# Patient Record
Sex: Female | Born: 1955 | Race: White | Hispanic: No
Health system: Midwestern US, Academic
[De-identification: ages and names within clinical notes are randomized; demographics above are authoritative.]

---

## 2018-03-01 ENCOUNTER — Encounter: Admit: 2018-03-01 | Discharge: 2018-03-01 | Payer: MEDICARE

## 2018-03-31 ENCOUNTER — Encounter: Admit: 2018-03-31 | Discharge: 2018-03-31 | Payer: MEDICARE

## 2018-03-31 ENCOUNTER — Inpatient Hospital Stay: Admit: 2018-03-31 | Discharge: 2018-03-31 | Payer: MEDICARE

## 2018-03-31 ENCOUNTER — Encounter: Admit: 2018-03-31 | Discharge: 2018-03-31

## 2018-03-31 ENCOUNTER — Inpatient Hospital Stay
Admit: 2018-03-31 | Discharge: 2018-04-07 | Disposition: A | Discharge status: Home / Self Care | Payer: MEDICARE | Source: Other Acute Inpatient Hospital | Attending: Cardiovascular Disease | Admitting: Cardiovascular Disease

## 2018-03-31 DIAGNOSIS — I25118 Atherosclerotic heart disease of native coronary artery with other forms of angina pectoris: ICD-10-CM

## 2018-03-31 DIAGNOSIS — R69 Illness, unspecified: Principal | ICD-10-CM

## 2018-03-31 DIAGNOSIS — I2510 Atherosclerotic heart disease of native coronary artery without angina pectoris: Secondary | ICD-10-CM

## 2018-03-31 LAB — COMPREHENSIVE METABOLIC PANEL
Lab: 1.1 mg/dL — ABNORMAL HIGH (ref 0.3–1.2)
Lab: 1.1 mg/dL — ABNORMAL HIGH (ref 0.4–1.00)
Lab: 144 MMOL/L — ABNORMAL LOW (ref 137–147)
Lab: 15 K/UL — ABNORMAL HIGH (ref 3–12)
Lab: 16 mg/dL (ref 7–25)
Lab: 21 MMOL/L (ref 21–30)
Lab: 283 mg/dL — ABNORMAL HIGH (ref 70–100)
Lab: 3.3 g/dL — ABNORMAL LOW (ref 3.5–5.0)
Lab: 41 U/L — ABNORMAL HIGH (ref 7–56)
Lab: 49 mL/min — ABNORMAL LOW (ref >60)
Lab: 5.7 g/dL — ABNORMAL LOW (ref 6.0–8.0)
Lab: 60 mL/min — ABNORMAL LOW (ref >60)
Lab: 7.5 mg/dL — ABNORMAL LOW (ref 8.5–10.6)
Lab: 71 U/L — ABNORMAL HIGH (ref 7–40)

## 2018-03-31 LAB — BLOOD GASES, ARTERIAL
Lab: 131 mmHg — ABNORMAL HIGH (ref 80–100)
Lab: 21 MMOL/L (ref 21–28)
Lab: 3.1 MMOL/L
Lab: 36 mmHg (ref 35–45)
Lab: 7.3 (ref 7.35–7.45)
Lab: 98 % (ref 95–99)

## 2018-03-31 LAB — LACTIC ACID (BG - RAPID LACTATE): Lab: 6.7 MMOL/L — ABNORMAL HIGH (ref 0.5–2.0)

## 2018-03-31 LAB — CBC AND DIFF
Lab: 0 10*3/uL (ref 0–0.20)
Lab: 21 10*3/uL — ABNORMAL HIGH (ref 4.5–11.0)

## 2018-03-31 LAB — TROPONIN-I: Lab: 0.8 ng/mL — ABNORMAL HIGH (ref 0.0–0.05)

## 2018-03-31 LAB — PROTIME INR (PT): Lab: 1.2 M/UL — ABNORMAL LOW (ref 0.8–1.2)

## 2018-03-31 LAB — MAGNESIUM: Lab: 1.8 mg/dL — ABNORMAL LOW (ref 1.6–2.6)

## 2018-03-31 LAB — LACTIC ACID(LACTATE): Lab: 7.8 MMOL/L — ABNORMAL HIGH (ref 0.5–2.0)

## 2018-03-31 MED ORDER — PROPOFOL 10 MG/ML IV EMUL
5-150 ug/kg/min | INTRAVENOUS | 0 refills | Status: DC
Start: 2018-03-31 — End: 2018-04-01
  Administered 2018-03-31 – 2018-04-01 (×2): 15 ug/kg/min via INTRAVENOUS

## 2018-03-31 MED ORDER — POTASSIUM CHLORIDE 20 MEQ PO TBTQ
60 meq | Freq: Once | ORAL | 0 refills | Status: AC
Start: 2018-03-31 — End: ?

## 2018-03-31 MED ORDER — FAMOTIDINE 20 MG PO TAB
20 mg | Freq: Two times a day (BID) | NASOGASTRIC | 0 refills | Status: DC
Start: 2018-03-31 — End: 2018-04-01
  Administered 2018-04-01 (×2): 20 mg via NASOGASTRIC

## 2018-03-31 MED ORDER — CEFEPIME 2G/100ML NS IVPB (MB+)
2 g | Freq: Two times a day (BID) | INTRAVENOUS | 0 refills | Status: DC
Start: 2018-03-31 — End: 2018-04-03
  Administered 2018-03-31 – 2018-04-03 (×12): 2 g via INTRAVENOUS

## 2018-03-31 MED ORDER — MAGNESIUM SULFATE IN D5W 1 GRAM/100 ML IV PGBK
1 g | INTRAVENOUS | 0 refills | Status: CP
Start: 2018-03-31 — End: ?
  Administered 2018-03-31 (×2): 1 g via INTRAVENOUS

## 2018-03-31 MED ORDER — CHLORHEXIDINE GLUCONATE 0.12 % MM MWSH
15 mL | Freq: Two times a day (BID) | ORAL | 0 refills | Status: DC
Start: 2018-03-31 — End: 2018-04-01
  Administered 2018-04-01 (×2): 15 mL via ORAL

## 2018-03-31 MED ORDER — ALBUTEROL SULFATE 2.5 MG/0.5 ML IN NEBU
2.5 mg | RESPIRATORY_TRACT | 0 refills | Status: DC | PRN
Start: 2018-03-31 — End: 2018-04-01
  Administered 2018-04-01 (×4): 2.5 mg via RESPIRATORY_TRACT

## 2018-03-31 MED ORDER — POLYETHYLENE GLYCOL 3350 17 GRAM PO PWPK
1 | Freq: Every day | NASOGASTRIC | 0 refills | Status: DC
Start: 2018-03-31 — End: 2018-04-01
  Administered 2018-04-01: 13:00:00 17 g via NASOGASTRIC

## 2018-03-31 MED ORDER — ASPIRIN 81 MG PO CHEW
81 mg | Freq: Every day | NASOGASTRIC | 0 refills | Status: DC
Start: 2018-03-31 — End: 2018-04-01
  Administered 2018-03-31 – 2018-04-01 (×2): 81 mg via NASOGASTRIC

## 2018-03-31 MED ORDER — IPRATROPIUM BROMIDE 0.02 % IN SOLN
0.5 mg | RESPIRATORY_TRACT | 0 refills | Status: DC | PRN
Start: 2018-03-31 — End: 2018-04-01
  Administered 2018-04-01 (×4): 0.5 mg via RESPIRATORY_TRACT

## 2018-03-31 MED ORDER — POTASSIUM CHLORIDE 20 MEQ/15 ML PO LIQD
60 meq | Freq: Once | ORAL | 0 refills | Status: CP
Start: 2018-03-31 — End: ?
  Administered 2018-04-01: 01:00:00 60 meq via ORAL

## 2018-03-31 MED ORDER — VANCOMYCIN 1G/250ML D5W IVPB (VIAL2BAG)
15 mg/kg | INTRAVENOUS | 0 refills | Status: DC
Start: 2018-03-31 — End: 2018-04-04
  Administered 2018-04-01 – 2018-04-03 (×8): 1000 mg via INTRAVENOUS

## 2018-03-31 MED ORDER — POTASSIUM CHLORIDE IN WATER 10 MEQ/50 ML IV PGBK
10 meq | INTRAVENOUS | 0 refills | Status: CP
Start: 2018-03-31 — End: ?
  Administered 2018-03-31 – 2018-04-01 (×3): 10 meq via INTRAVENOUS

## 2018-03-31 MED ORDER — IPRATROPIUM BROMIDE 0.02 % IN SOLN
0.5 mg | RESPIRATORY_TRACT | 0 refills | Status: DC | PRN
Start: 2018-03-31 — End: 2018-03-31

## 2018-03-31 MED ORDER — TIOTROPIUM BROMIDE 18 MCG IN CPDV
1 | Freq: Every day | RESPIRATORY_TRACT | 0 refills | Status: DC
Start: 2018-03-31 — End: 2018-04-01

## 2018-03-31 MED ORDER — VANCOMYCIN PHARMACY TO MANAGE
1 | 0 refills | Status: DC
Start: 2018-03-31 — End: 2018-04-04

## 2018-03-31 MED ORDER — FENTANYL CITRATE (PF) 50 MCG/ML IJ SOLN
25 ug | Freq: Once | INTRAVENOUS | 0 refills | Status: CP
Start: 2018-03-31 — End: ?
  Administered 2018-04-01: 01:00:00 25 ug via INTRAVENOUS

## 2018-03-31 MED ORDER — NOREPINEPHRINE IV DRIP (STD CONC)
.01-.3 ug/kg/min | INTRAVENOUS | 0 refills | Status: DC
Start: 2018-03-31 — End: 2018-04-02
  Administered 2018-03-31 (×2): 0.04 ug/kg/min via INTRAVENOUS

## 2018-03-31 MED ORDER — ALBUTEROL SULFATE 2.5 MG/0.5 ML IN NEBU
2.5 mg | RESPIRATORY_TRACT | 0 refills | Status: DC | PRN
Start: 2018-03-31 — End: 2018-03-31

## 2018-03-31 MED ORDER — ATORVASTATIN 40 MG PO TAB
40 mg | Freq: Every evening | NASOGASTRIC | 0 refills | Status: DC
Start: 2018-03-31 — End: 2018-04-01
  Administered 2018-04-01: 01:00:00 40 mg via NASOGASTRIC

## 2018-03-31 MED ADMIN — POTASSIUM CHLORIDE IN WATER 10 MEQ/50 ML IV PGBK [11075]: 10 meq | INTRAVENOUS | @ 22:00:00 | Stop: 2018-03-31 | NDC 00338070541

## 2018-04-01 ENCOUNTER — Inpatient Hospital Stay: Admit: 2018-04-01 | Discharge: 2018-04-01 | Payer: MEDICARE

## 2018-04-01 ENCOUNTER — Encounter: Admit: 2018-04-01 | Discharge: 2018-04-01 | Payer: MEDICARE

## 2018-04-01 DIAGNOSIS — Z87891 Personal history of nicotine dependence: ICD-10-CM

## 2018-04-01 DIAGNOSIS — I999 Unspecified disorder of circulatory system: ICD-10-CM

## 2018-04-01 DIAGNOSIS — E785 Hyperlipidemia, unspecified: Secondary | ICD-10-CM

## 2018-04-01 DIAGNOSIS — I255 Ischemic cardiomyopathy: ICD-10-CM

## 2018-04-01 DIAGNOSIS — I2510 Atherosclerotic heart disease of native coronary artery without angina pectoris: ICD-10-CM

## 2018-04-01 DIAGNOSIS — I739 Peripheral vascular disease, unspecified: ICD-10-CM

## 2018-04-01 DIAGNOSIS — I219 Acute myocardial infarction, unspecified: ICD-10-CM

## 2018-04-01 DIAGNOSIS — M1990 Unspecified osteoarthritis, unspecified site: ICD-10-CM

## 2018-04-01 DIAGNOSIS — R200 Anesthesia of skin: ICD-10-CM

## 2018-04-01 DIAGNOSIS — I724 Aneurysm of artery of lower extremity: ICD-10-CM

## 2018-04-01 DIAGNOSIS — Z720 Tobacco use: ICD-10-CM

## 2018-04-01 DIAGNOSIS — J95821 Acute postprocedural respiratory failure: ICD-10-CM

## 2018-04-01 DIAGNOSIS — Z8679 Personal history of other diseases of the circulatory system: ICD-10-CM

## 2018-04-01 DIAGNOSIS — M109 Gout, unspecified: ICD-10-CM

## 2018-04-01 LAB — BASIC METABOLIC PANEL
Lab: 1 mg/dL — ABNORMAL HIGH (ref 0.4–1.00)
Lab: 109 MMOL/L — ABNORMAL LOW (ref 98–110)
Lab: 12 pg (ref 3–12)
Lab: 142 MMOL/L — ABNORMAL LOW (ref 137–147)
Lab: 17 mg/dL — ABNORMAL HIGH (ref 7–25)
Lab: 185 mg/dL — ABNORMAL HIGH (ref 70–100)
Lab: 21 MMOL/L — ABNORMAL LOW (ref 21–30)
Lab: 4.2 MMOL/L — ABNORMAL LOW (ref 3.5–5.1)
Lab: 56 mL/min — ABNORMAL LOW (ref >60)
Lab: 7.9 mg/dL — ABNORMAL LOW (ref 8.5–10.6)
Lab: >60 mL/min (ref >60)
Lab: 142 MMOL/L — ABNORMAL HIGH (ref 137–147)

## 2018-04-01 LAB — LACTIC ACID(LACTATE)
Lab: 7 MMOL/L — ABNORMAL HIGH (ref 0.5–2.0)
Lab: 3.7 MMOL/L — ABNORMAL HIGH (ref 0.5–2.0)
Lab: 4.1 MMOL/L — ABNORMAL HIGH (ref 0.5–2.0)
Lab: 4.8 MMOL/L — ABNORMAL HIGH (ref 0.5–2.0)
Lab: 3 MMOL/L — ABNORMAL HIGH (ref 0.5–2.0)

## 2018-04-01 LAB — TROPONIN-I
Lab: 1.3 ng/mL — ABNORMAL HIGH (ref 0.0–0.05)
Lab: 1 ng/mL — ABNORMAL HIGH (ref 0.0–0.05)

## 2018-04-01 LAB — LACTIC ACID (BG - RAPID LACTATE)
Lab: 5.6 MMOL/L — ABNORMAL HIGH (ref 0.5–2.0)
Lab: 4.8 MMOL/L — ABNORMAL HIGH (ref 0.5–2.0)
Lab: 3.3 MMOL/L — ABNORMAL HIGH (ref 0.5–2.0)

## 2018-04-01 LAB — MAGNESIUM
Lab: 2.2 mg/dL (ref 1.6–2.6)
Lab: 2.3 mg/dL — ABNORMAL LOW (ref >60)

## 2018-04-01 LAB — PROCALCITONIN: Lab: 12 ng/mL — ABNORMAL HIGH (ref <0.10)

## 2018-04-01 LAB — CBC: Lab: 15 K/UL — ABNORMAL HIGH (ref >60)

## 2018-04-01 LAB — BLOOD GASES, ARTERIAL: Lab: 7.4 M/UL — ABNORMAL HIGH (ref >60)

## 2018-04-01 MED ORDER — POLYETHYLENE GLYCOL 3350 17 GRAM PO PWPK
1 | Freq: Every day | NASOGASTRIC | 0 refills | Status: DC
Start: 2018-04-01 — End: 2018-04-07
  Administered 2018-04-03 – 2018-04-07 (×3): 17 g via NASOGASTRIC

## 2018-04-01 MED ORDER — METOPROLOL TARTRATE 25 MG PO TAB
12.5 mg | Freq: Two times a day (BID) | ORAL | 0 refills | Status: DC
Start: 2018-04-01 — End: 2018-04-04
  Administered 2018-04-01 – 2018-04-04 (×6): 12.5 mg via ORAL

## 2018-04-01 MED ORDER — ASPIRIN 81 MG PO CHEW
81 mg | Freq: Every day | NASOGASTRIC | 0 refills | Status: DC
Start: 2018-04-01 — End: 2018-04-07
  Administered 2018-04-02 – 2018-04-07 (×6): 81 mg via NASOGASTRIC

## 2018-04-01 MED ORDER — FENTANYL CITRATE (PF) 50 MCG/ML IJ SOLN
25 ug | INTRAVENOUS | 0 refills | Status: DC | PRN
Start: 2018-04-01 — End: 2018-04-03
  Administered 2018-04-01: 13:00:00 25 ug via INTRAVENOUS

## 2018-04-01 MED ORDER — ATORVASTATIN 40 MG PO TAB
40 mg | Freq: Every evening | NASOGASTRIC | 0 refills | Status: DC
Start: 2018-04-01 — End: 2018-04-07
  Administered 2018-04-02 – 2018-04-07 (×6): 40 mg via NASOGASTRIC

## 2018-04-01 MED ORDER — ACETAMINOPHEN 325 MG PO TAB
650 mg | ORAL | 0 refills | Status: DC | PRN
Start: 2018-04-01 — End: 2018-04-07
  Administered 2018-04-02 – 2018-04-07 (×11): 650 mg via ORAL

## 2018-04-01 MED ORDER — PERFLUTREN LIPID MICROSPHERES 1.1 MG/ML IV SUSP
1-20 mL | Freq: Once | INTRAVENOUS | 0 refills | Status: CP | PRN
Start: 2018-04-01 — End: ?
  Administered 2018-04-01: 15:00:00 1 mL via INTRAVENOUS

## 2018-04-01 MED ORDER — IPRATROPIUM BROMIDE 0.02 % IN SOLN
0.5 mg | Freq: Four times a day (QID) | RESPIRATORY_TRACT | 0 refills | Status: DC | PRN
Start: 2018-04-01 — End: 2018-04-07
  Administered 2018-04-01 – 2018-04-07 (×23): 0.5 mg via RESPIRATORY_TRACT

## 2018-04-01 MED ORDER — ALBUTEROL SULFATE 2.5 MG/0.5 ML IN NEBU
2.5 mg | Freq: Four times a day (QID) | RESPIRATORY_TRACT | 0 refills | Status: DC | PRN
Start: 2018-04-01 — End: 2018-04-07
  Administered 2018-04-01 – 2018-04-07 (×23): 2.5 mg via RESPIRATORY_TRACT

## 2018-04-01 MED ORDER — FAMOTIDINE 20 MG PO TAB
20 mg | Freq: Two times a day (BID) | NASOGASTRIC | 0 refills | Status: DC
Start: 2018-04-01 — End: 2018-04-01

## 2018-04-01 MED ORDER — FUROSEMIDE 10 MG/ML IJ SOLN
40 mg | Freq: Once | INTRAVENOUS | 0 refills | Status: CP
Start: 2018-04-01 — End: ?
  Administered 2018-04-01: 18:00:00 40 mg via INTRAVENOUS

## 2018-04-01 MED ORDER — CLOPIDOGREL 75 MG PO TAB
75 mg | Freq: Every day | ORAL | 0 refills | Status: DC
Start: 2018-04-01 — End: 2018-04-07
  Administered 2018-04-01 – 2018-04-07 (×7): 75 mg via ORAL

## 2018-04-01 MED ORDER — ENOXAPARIN 40 MG/0.4 ML SC SYRG
40 mg | Freq: Every evening | SUBCUTANEOUS | 0 refills | Status: DC
Start: 2018-04-01 — End: 2018-04-04
  Administered 2018-04-02 – 2018-04-04 (×2): 40 mg via SUBCUTANEOUS

## 2018-04-02 ENCOUNTER — Inpatient Hospital Stay: Admit: 2018-04-02 | Discharge: 2018-04-02 | Payer: MEDICARE

## 2018-04-02 LAB — BASIC METABOLIC PANEL: Lab: 140 MMOL/L — ABNORMAL LOW (ref 137–147)

## 2018-04-02 LAB — MAGNESIUM: Lab: 2.2 mg/dL — ABNORMAL LOW (ref 1.6–2.6)

## 2018-04-02 LAB — CBC: Lab: 25 10*3/uL — ABNORMAL HIGH (ref >60)

## 2018-04-02 MED ORDER — SPIRONOLACTONE 25 MG PO TAB
12.5 mg | Freq: Every day | ORAL | 0 refills | Status: DC
Start: 2018-04-02 — End: 2018-04-07
  Administered 2018-04-02 – 2018-04-07 (×6): 12.5 mg via ORAL

## 2018-04-03 DIAGNOSIS — I2510 Atherosclerotic heart disease of native coronary artery without angina pectoris: Secondary | ICD-10-CM

## 2018-04-03 LAB — CBC
Lab: 10 g/dL — ABNORMAL LOW (ref 12.0–15.0)
Lab: 12 K/UL — ABNORMAL HIGH (ref 4.5–11.0)

## 2018-04-03 LAB — BASIC METABOLIC PANEL: Lab: 139 MMOL/L — ABNORMAL HIGH (ref >60)

## 2018-04-03 LAB — VANCOMYCIN TROUGH: Lab: 9.6 ug/mL — ABNORMAL LOW (ref 10.0–20.0)

## 2018-04-03 LAB — MAGNESIUM: Lab: 2 mg/dL — ABNORMAL LOW (ref 1.6–2.6)

## 2018-04-03 MED ORDER — VANCOMYCIN 1G/250ML D5W IVPB (VIAL2BAG)
15 mg/kg | Freq: Two times a day (BID) | INTRAVENOUS | 0 refills | Status: DC
Start: 2018-04-03 — End: 2018-04-03

## 2018-04-03 MED ORDER — LIDOCAINE (PF) 10 MG/ML (1 %) IJ SOLN
.1-2 mL | INTRAMUSCULAR | 0 refills | Status: DC | PRN
Start: 2018-04-03 — End: 2018-04-07

## 2018-04-03 MED ORDER — ONDANSETRON HCL (PF) 4 MG/2 ML IJ SOLN
4 mg | INTRAVENOUS | 0 refills | Status: DC | PRN
Start: 2018-04-03 — End: 2018-04-04

## 2018-04-03 MED ORDER — POTASSIUM CHLORIDE 20 MEQ PO TBTQ
40 meq | Freq: Once | ORAL | 0 refills | Status: CP
Start: 2018-04-03 — End: ?
  Administered 2018-04-03: 11:00:00 40 meq via ORAL

## 2018-04-03 MED ORDER — CEFEPIME 2G/100ML NS IVPB (MB+)
2 g | INTRAVENOUS | 0 refills | Status: DC
Start: 2018-04-03 — End: 2018-04-04
  Administered 2018-04-03 – 2018-04-04 (×6): 2 g via INTRAVENOUS

## 2018-04-03 MED ORDER — LORAZEPAM 2 MG/ML IJ SOLN
.5-1 mg | Freq: Once | INTRAVENOUS | 0 refills | Status: CP
Start: 2018-04-03 — End: ?
  Administered 2018-04-04: 03:00:00 0.5 mg via INTRAVENOUS

## 2018-04-03 MED ORDER — FUROSEMIDE 10 MG/ML IJ SOLN
40 mg | Freq: Once | INTRAVENOUS | 0 refills | Status: CP
Start: 2018-04-03 — End: ?
  Administered 2018-04-03: 17:00:00 40 mg via INTRAVENOUS

## 2018-04-03 MED ADMIN — ONDANSETRON HCL (PF) 4 MG/2 ML IJ SOLN [136012]: 4 mg | INTRAVENOUS | @ 17:00:00 | Stop: 2018-04-03 | NDC 00641607801

## 2018-04-04 ENCOUNTER — Encounter: Admit: 2018-04-04 | Discharge: 2018-04-04 | Payer: MEDICARE

## 2018-04-04 ENCOUNTER — Inpatient Hospital Stay: Admit: 2018-04-04 | Discharge: 2018-04-04 | Payer: MEDICARE

## 2018-04-04 DIAGNOSIS — I219 Acute myocardial infarction, unspecified: ICD-10-CM

## 2018-04-04 DIAGNOSIS — I724 Aneurysm of artery of lower extremity: ICD-10-CM

## 2018-04-04 DIAGNOSIS — J95821 Acute postprocedural respiratory failure: ICD-10-CM

## 2018-04-04 DIAGNOSIS — I469 Cardiac arrest, cause unspecified: ICD-10-CM

## 2018-04-04 DIAGNOSIS — M109 Gout, unspecified: ICD-10-CM

## 2018-04-04 DIAGNOSIS — I2510 Atherosclerotic heart disease of native coronary artery without angina pectoris: ICD-10-CM

## 2018-04-04 DIAGNOSIS — Z95810 Presence of automatic (implantable) cardiac defibrillator: ICD-10-CM

## 2018-04-04 DIAGNOSIS — Z87891 Personal history of nicotine dependence: ICD-10-CM

## 2018-04-04 DIAGNOSIS — E785 Hyperlipidemia, unspecified: Principal | ICD-10-CM

## 2018-04-04 DIAGNOSIS — I255 Ischemic cardiomyopathy: ICD-10-CM

## 2018-04-04 DIAGNOSIS — M1990 Unspecified osteoarthritis, unspecified site: ICD-10-CM

## 2018-04-04 DIAGNOSIS — I739 Peripheral vascular disease, unspecified: ICD-10-CM

## 2018-04-04 DIAGNOSIS — R200 Anesthesia of skin: ICD-10-CM

## 2018-04-04 DIAGNOSIS — Z8679 Personal history of other diseases of the circulatory system: ICD-10-CM

## 2018-04-04 DIAGNOSIS — Z9981 Dependence on supplemental oxygen: ICD-10-CM

## 2018-04-04 LAB — BASIC METABOLIC PANEL: Lab: 135 MMOL/L — ABNORMAL LOW (ref >60)

## 2018-04-04 LAB — CBC: Lab: 11 K/UL — ABNORMAL HIGH (ref 4.5–11.0)

## 2018-04-04 LAB — MAGNESIUM: Lab: 1.9 mg/dL — ABNORMAL LOW (ref 1.6–2.6)

## 2018-04-04 MED ORDER — FENTANYL CITRATE (PF) 50 MCG/ML IJ SOLN
25 ug | Freq: Once | INTRAVENOUS | 0 refills | Status: CP
Start: 2018-04-04 — End: ?
  Administered 2018-04-05: 03:00:00 25 ug via INTRAVENOUS

## 2018-04-04 MED ORDER — ACETAMINOPHEN 325 MG PO TAB
325-650 mg | ORAL | 0 refills | Status: DC | PRN
Start: 2018-04-04 — End: 2018-04-07

## 2018-04-04 MED ORDER — LIDOCAINE 5 % TP PTMD
1 | Freq: Every day | TOPICAL | 0 refills | Status: DC
Start: 2018-04-04 — End: 2018-04-07
  Administered 2018-04-04 – 2018-04-06 (×3): 1 via TOPICAL

## 2018-04-04 MED ORDER — PROPOFOL INJ 10 MG/ML IV VIAL
0 refills | Status: DC
Start: 2018-04-04 — End: 2018-04-04

## 2018-04-04 MED ORDER — SODIUM CHLORIDE 0.9 % IV SOLP
0 refills | Status: DC
Start: 2018-04-04 — End: 2018-04-04

## 2018-04-04 MED ORDER — VANCOMYCIN IN DEXTROSE 5 % 750 MG/150 ML IV PGBK
750 mg | Freq: Two times a day (BID) | INTRAVENOUS | 0 refills | Status: DC
Start: 2018-04-04 — End: 2018-04-04
  Administered 2018-04-04: 14:00:00 750 mg via INTRAVENOUS

## 2018-04-04 MED ORDER — VANCOMYCIN PHARMACY TO MANAGE
1 | 0 refills | Status: DC
Start: 2018-04-04 — End: 2018-04-04

## 2018-04-04 MED ORDER — HYDROCODONE-ACETAMINOPHEN 5-325 MG PO TAB
1 | ORAL | 0 refills | Status: DC | PRN
Start: 2018-04-04 — End: 2018-04-04

## 2018-04-04 MED ORDER — PROMETHAZINE 25 MG/ML IJ SOLN
6.25 mg | INTRAVENOUS | 0 refills | Status: DC | PRN
Start: 2018-04-04 — End: 2018-04-07

## 2018-04-04 MED ORDER — FENTANYL CITRATE (PF) 50 MCG/ML IJ SOLN
25 ug | INTRAVENOUS | 0 refills | Status: DC | PRN
Start: 2018-04-04 — End: 2018-04-05

## 2018-04-04 MED ORDER — CEFEPIME 1G/100ML NS IVPB (MB+)
1 g | INTRAVENOUS | 0 refills | Status: CP
Start: 2018-04-04 — End: ?
  Administered 2018-04-04 – 2018-04-07 (×16): 1 g via INTRAVENOUS

## 2018-04-04 MED ORDER — ONDANSETRON HCL (PF) 4 MG/2 ML IJ SOLN
4 mg | INTRAVENOUS | 0 refills | Status: DC | PRN
Start: 2018-04-04 — End: 2018-04-07
  Administered 2018-04-07: 01:00:00 4 mg via INTRAVENOUS

## 2018-04-04 MED ORDER — FUROSEMIDE 10 MG/ML IJ SOLN
40 mg | Freq: Once | INTRAVENOUS | 0 refills | Status: CP
Start: 2018-04-04 — End: ?
  Administered 2018-04-04: 09:00:00 40 mg via INTRAVENOUS

## 2018-04-04 MED ORDER — METOPROLOL SUCCINATE 25 MG PO TB24
25 mg | Freq: Every day | ORAL | 0 refills | Status: DC
Start: 2018-04-04 — End: 2018-04-07
  Administered 2018-04-05 – 2018-04-06 (×2): 25 mg via ORAL

## 2018-04-04 MED ORDER — METOCLOPRAMIDE HCL 5 MG/ML IJ SOLN
10 mg | Freq: Once | INTRAVENOUS | 0 refills | Status: AC | PRN
Start: 2018-04-04 — End: ?

## 2018-04-04 MED ORDER — PHENYLEPHRINE IN 0.9% NACL(PF) 1 MG/10 ML (100 MCG/ML) IV SYRG
0 refills | Status: DC
Start: 2018-04-04 — End: 2018-04-04

## 2018-04-04 MED ORDER — PATCH DOCUMENTATION - LIDOCAINE 5%
1 | Freq: Two times a day (BID) | TRANSDERMAL | 0 refills | Status: DC
Start: 2018-04-04 — End: 2018-04-07

## 2018-04-04 MED ORDER — MAGNESIUM SULFATE IN D5W 1 GRAM/100 ML IV PGBK
1 g | Freq: Once | INTRAVENOUS | 0 refills | Status: CP
Start: 2018-04-04 — End: ?
  Administered 2018-04-04: 13:00:00 1 g via INTRAVENOUS

## 2018-04-05 ENCOUNTER — Inpatient Hospital Stay: Admit: 2018-04-05 | Discharge: 2018-04-05 | Payer: MEDICARE

## 2018-04-05 ENCOUNTER — Encounter: Admit: 2018-04-05 | Discharge: 2018-04-05 | Payer: MEDICARE

## 2018-04-05 LAB — BASIC METABOLIC PANEL: Lab: 135 MMOL/L — ABNORMAL LOW (ref 137–147)

## 2018-04-05 LAB — MAGNESIUM: Lab: 2.1 mg/dL — ABNORMAL LOW (ref 1.6–2.6)

## 2018-04-05 LAB — CBC: Lab: 10 K/UL — ABNORMAL HIGH (ref 4.5–11.0)

## 2018-04-05 MED ORDER — BUMETANIDE 0.5 MG PO TAB
.5 mg | Freq: Every day | ORAL | 0 refills | Status: DC
Start: 2018-04-05 — End: 2018-04-07
  Administered 2018-04-06 – 2018-04-07 (×2): 0.5 mg via ORAL

## 2018-04-05 MED ORDER — NITROGLYCERIN 0.4 MG SL SUBL
.4 mg | SUBLINGUAL | 0 refills | Status: AC | PRN
Start: 2018-04-05 — End: ?

## 2018-04-05 MED ORDER — AMINOPHYLLINE 500 MG/20 ML IV SOLN
50 mg | INTRAVENOUS | 0 refills | Status: AC | PRN
Start: 2018-04-05 — End: ?
  Administered 2018-04-05: 16:00:00 50 mg via INTRAVENOUS

## 2018-04-05 MED ORDER — REGADENOSON 0.4 MG/5 ML IV SYRG
.4 mg | Freq: Once | INTRAVENOUS | 0 refills | Status: CP
Start: 2018-04-05 — End: ?
  Administered 2018-04-05: 16:00:00 0.4 mg via INTRAVENOUS

## 2018-04-05 MED ORDER — HYDROXYZINE HCL 25 MG PO TAB
25 mg | ORAL | 0 refills | Status: DC | PRN
Start: 2018-04-05 — End: 2018-04-07
  Administered 2018-04-06 (×2): 25 mg via ORAL

## 2018-04-05 MED ORDER — ALBUTEROL SULFATE 90 MCG/ACTUATION IN HFAA
2 | RESPIRATORY_TRACT | 0 refills | Status: DC | PRN
Start: 2018-04-05 — End: 2018-04-07

## 2018-04-05 MED ORDER — TRAZODONE 50 MG PO TAB
50 mg | Freq: Every evening | ORAL | 0 refills | Status: DC | PRN
Start: 2018-04-05 — End: 2018-04-07
  Administered 2018-04-06: 03:00:00 50 mg via ORAL

## 2018-04-05 MED ORDER — SODIUM CHLORIDE 0.9 % IV SOLP
250 mL | INTRAVENOUS | 0 refills | Status: AC | PRN
Start: 2018-04-05 — End: ?

## 2018-04-06 ENCOUNTER — Encounter: Admit: 2018-04-06 | Discharge: 2018-04-06 | Payer: MEDICARE

## 2018-04-06 LAB — CBC: Lab: 11 K/UL — ABNORMAL HIGH (ref 4.5–11.0)

## 2018-04-06 LAB — CULTURE-BLOOD W/SENSITIVITY

## 2018-04-06 LAB — IRON + BINDING CAPACITY + %SAT+ FERRITIN
Lab: 10 % — ABNORMAL LOW (ref 28–42)
Lab: 218 ng/mL — ABNORMAL HIGH (ref 10–200)
Lab: 39 ug/dL — ABNORMAL LOW (ref 50–160)
Lab: 402 ug/dL — ABNORMAL HIGH (ref 270–380)

## 2018-04-06 LAB — POC ACTIVATED CLOTTING TIME: Lab: 180 s (ref 0–0.45)

## 2018-04-06 LAB — MAGNESIUM: Lab: 2 mg/dL — ABNORMAL LOW (ref 1.6–2.6)

## 2018-04-06 LAB — BASIC METABOLIC PANEL: Lab: 137 MMOL/L — ABNORMAL HIGH (ref 137–147)

## 2018-04-06 MED ORDER — ASPIRIN 325 MG PO TAB
325 mg | Freq: Once | ORAL | 0 refills | Status: DC
Start: 2018-04-06 — End: 2018-04-06

## 2018-04-06 MED ORDER — ASPIRIN 81 MG PO TBEC
243 mg | Freq: Once | ORAL | 0 refills | Status: CP
Start: 2018-04-06 — End: ?
  Administered 2018-04-06: 15:00:00 243 mg via ORAL

## 2018-04-06 MED ORDER — IRON SUCROSE 300 MG IRON/15 ML IV SOLN
300 mg | INTRAVENOUS | 0 refills | Status: DC
Start: 2018-04-06 — End: 2018-04-07
  Administered 2018-04-06 (×2): 300 mg via INTRAVENOUS

## 2018-04-06 MED ORDER — ALUMINUM-MAGNESIUM HYDROXIDE 200-200 MG/5 ML PO SUSP
30 mL | ORAL | 0 refills | Status: DC | PRN
Start: 2018-04-06 — End: 2018-04-07

## 2018-04-07 ENCOUNTER — Inpatient Hospital Stay: Admit: 2018-04-05 | Discharge: 2018-04-05 | Payer: MEDICARE

## 2018-04-07 ENCOUNTER — Inpatient Hospital Stay: Admit: 2018-04-01 | Discharge: 2018-04-01 | Payer: MEDICARE

## 2018-04-07 ENCOUNTER — Inpatient Hospital Stay: Admit: 2018-03-31 | Discharge: 2018-03-31 | Payer: MEDICARE

## 2018-04-07 DIAGNOSIS — I255 Ischemic cardiomyopathy: ICD-10-CM

## 2018-04-07 DIAGNOSIS — I2510 Atherosclerotic heart disease of native coronary artery without angina pectoris: ICD-10-CM

## 2018-04-07 DIAGNOSIS — F419 Anxiety disorder, unspecified: ICD-10-CM

## 2018-04-07 DIAGNOSIS — Z951 Presence of aortocoronary bypass graft: ICD-10-CM

## 2018-04-07 DIAGNOSIS — I252 Old myocardial infarction: ICD-10-CM

## 2018-04-07 DIAGNOSIS — F1290 Cannabis use, unspecified, uncomplicated: ICD-10-CM

## 2018-04-07 DIAGNOSIS — I4901 Ventricular fibrillation: Principal | ICD-10-CM

## 2018-04-07 DIAGNOSIS — E785 Hyperlipidemia, unspecified: ICD-10-CM

## 2018-04-07 DIAGNOSIS — I5022 Chronic systolic (congestive) heart failure: ICD-10-CM

## 2018-04-07 DIAGNOSIS — F17210 Nicotine dependence, cigarettes, uncomplicated: ICD-10-CM

## 2018-04-07 DIAGNOSIS — I462 Cardiac arrest due to underlying cardiac condition: ICD-10-CM

## 2018-04-07 DIAGNOSIS — M109 Gout, unspecified: ICD-10-CM

## 2018-04-07 DIAGNOSIS — Z9981 Dependence on supplemental oxygen: ICD-10-CM

## 2018-04-07 DIAGNOSIS — I739 Peripheral vascular disease, unspecified: ICD-10-CM

## 2018-04-07 DIAGNOSIS — I959 Hypotension, unspecified: ICD-10-CM

## 2018-04-07 DIAGNOSIS — I214 Non-ST elevation (NSTEMI) myocardial infarction: ICD-10-CM

## 2018-04-07 DIAGNOSIS — I110 Hypertensive heart disease with heart failure: ICD-10-CM

## 2018-04-07 DIAGNOSIS — J440 Chronic obstructive pulmonary disease with acute lower respiratory infection: ICD-10-CM

## 2018-04-07 DIAGNOSIS — D509 Iron deficiency anemia, unspecified: ICD-10-CM

## 2018-04-07 DIAGNOSIS — J189 Pneumonia, unspecified organism: ICD-10-CM

## 2018-04-07 DIAGNOSIS — J9611 Chronic respiratory failure with hypoxia: ICD-10-CM

## 2018-04-07 LAB — POC ACTIVATED CLOTTING TIME
Lab: 265 s
Lab: 219 s
Lab: 223 s
Lab: 304 s
Lab: 209 s

## 2018-04-07 LAB — MAGNESIUM: Lab: 2 mg/dL — ABNORMAL LOW (ref >60)

## 2018-04-07 LAB — CBC: Lab: 11 10*3/uL — ABNORMAL HIGH (ref 4.5–11.0)

## 2018-04-07 LAB — BASIC METABOLIC PANEL: Lab: 140 MMOL/L — ABNORMAL HIGH (ref 137–147)

## 2018-04-07 MED ORDER — SENNOSIDES 8.6 MG PO TAB
1 | Freq: Two times a day (BID) | ORAL | 0 refills | Status: DC
Start: 2018-04-07 — End: 2018-04-07
  Administered 2018-04-07: 14:00:00 1 via ORAL

## 2018-04-07 MED ORDER — BUMETANIDE 0.5 MG PO TAB
.5 mg | ORAL_TABLET | Freq: Every day | ORAL | 3 refills | Status: AC
Start: 2018-04-07 — End: 2018-07-28

## 2018-04-07 MED ORDER — METOPROLOL SUCCINATE 25 MG PO TB24
25 mg | ORAL_TABLET | Freq: Every day | ORAL | 3 refills | 90.00000 days | Status: AC
Start: 2018-04-07 — End: ?

## 2018-04-07 MED ORDER — SPIRONOLACTONE 25 MG PO TAB
12.5 mg | ORAL_TABLET | Freq: Every day | ORAL | 3 refills | 90.00000 days | Status: AC
Start: 2018-04-07 — End: 2019-06-27

## 2018-04-07 MED ORDER — IRON SUCROSE 300 MG IRON/15 ML IV SOLN
300 mg | INTRAVENOUS | 0 refills | Status: DC
Start: 2018-04-07 — End: 2018-04-07
  Administered 2018-04-07 (×2): 300 mg via INTRAVENOUS

## 2018-04-08 ENCOUNTER — Encounter: Admit: 2018-04-08 | Discharge: 2018-04-08 | Payer: MEDICARE

## 2018-04-08 DIAGNOSIS — J95821 Acute postprocedural respiratory failure: ICD-10-CM

## 2018-04-08 DIAGNOSIS — I255 Ischemic cardiomyopathy: ICD-10-CM

## 2018-04-08 DIAGNOSIS — I724 Aneurysm of artery of lower extremity: ICD-10-CM

## 2018-04-08 DIAGNOSIS — Z87891 Personal history of nicotine dependence: ICD-10-CM

## 2018-04-08 DIAGNOSIS — M1990 Unspecified osteoarthritis, unspecified site: ICD-10-CM

## 2018-04-08 DIAGNOSIS — M109 Gout, unspecified: ICD-10-CM

## 2018-04-08 DIAGNOSIS — Z8679 Personal history of other diseases of the circulatory system: ICD-10-CM

## 2018-04-08 DIAGNOSIS — R200 Anesthesia of skin: ICD-10-CM

## 2018-04-08 DIAGNOSIS — I219 Acute myocardial infarction, unspecified: ICD-10-CM

## 2018-04-08 DIAGNOSIS — I739 Peripheral vascular disease, unspecified: ICD-10-CM

## 2018-04-08 DIAGNOSIS — Z9981 Dependence on supplemental oxygen: ICD-10-CM

## 2018-04-08 DIAGNOSIS — I2510 Atherosclerotic heart disease of native coronary artery without angina pectoris: ICD-10-CM

## 2018-04-08 DIAGNOSIS — E785 Hyperlipidemia, unspecified: Principal | ICD-10-CM

## 2018-04-08 DIAGNOSIS — I469 Cardiac arrest, cause unspecified: ICD-10-CM

## 2018-04-12 ENCOUNTER — Encounter: Admit: 2018-04-12 | Discharge: 2018-04-12 | Payer: MEDICARE

## 2018-04-14 ENCOUNTER — Encounter: Admit: 2018-04-14 | Discharge: 2018-04-14 | Payer: MEDICARE

## 2018-04-21 ENCOUNTER — Encounter: Admit: 2018-04-21 | Discharge: 2018-04-21 | Payer: MEDICARE

## 2018-04-21 ENCOUNTER — Ambulatory Visit: Admit: 2018-04-21 | Discharge: 2018-04-22 | Payer: MEDICARE

## 2018-04-21 DIAGNOSIS — J95821 Acute postprocedural respiratory failure: ICD-10-CM

## 2018-04-21 DIAGNOSIS — Z87891 Personal history of nicotine dependence: ICD-10-CM

## 2018-04-21 DIAGNOSIS — I219 Acute myocardial infarction, unspecified: ICD-10-CM

## 2018-04-21 DIAGNOSIS — R200 Anesthesia of skin: ICD-10-CM

## 2018-04-21 DIAGNOSIS — Z8679 Personal history of other diseases of the circulatory system: ICD-10-CM

## 2018-04-21 DIAGNOSIS — I724 Aneurysm of artery of lower extremity: ICD-10-CM

## 2018-04-21 DIAGNOSIS — I739 Peripheral vascular disease, unspecified: ICD-10-CM

## 2018-04-21 DIAGNOSIS — Z9981 Dependence on supplemental oxygen: ICD-10-CM

## 2018-04-21 DIAGNOSIS — I25118 Atherosclerotic heart disease of native coronary artery with other forms of angina pectoris: ICD-10-CM

## 2018-04-21 DIAGNOSIS — I255 Ischemic cardiomyopathy: Principal | ICD-10-CM

## 2018-04-21 DIAGNOSIS — E782 Mixed hyperlipidemia: ICD-10-CM

## 2018-04-21 DIAGNOSIS — E785 Hyperlipidemia, unspecified: Principal | ICD-10-CM

## 2018-04-21 DIAGNOSIS — J449 Chronic obstructive pulmonary disease, unspecified: ICD-10-CM

## 2018-04-21 DIAGNOSIS — I469 Cardiac arrest, cause unspecified: ICD-10-CM

## 2018-04-21 DIAGNOSIS — I2510 Atherosclerotic heart disease of native coronary artery without angina pectoris: ICD-10-CM

## 2018-04-21 DIAGNOSIS — M1990 Unspecified osteoarthritis, unspecified site: ICD-10-CM

## 2018-04-21 DIAGNOSIS — I5022 Chronic systolic (congestive) heart failure: ICD-10-CM

## 2018-04-21 DIAGNOSIS — M109 Gout, unspecified: ICD-10-CM

## 2018-04-21 MED ORDER — MISCELLANEOUS MEDICAL SUPPLY MISC MISC
0 refills | 1.00000 days | Status: AC
Start: 2018-04-21 — End: 2018-10-28

## 2018-04-26 ENCOUNTER — Encounter: Admit: 2018-04-26 | Discharge: 2018-04-26 | Payer: MEDICARE

## 2018-05-03 ENCOUNTER — Encounter: Admit: 2018-05-03 | Discharge: 2018-05-03 | Payer: MEDICARE

## 2018-05-06 ENCOUNTER — Encounter: Admit: 2018-05-06 | Discharge: 2018-05-06 | Payer: MEDICARE

## 2018-05-30 ENCOUNTER — Encounter: Admit: 2018-05-30 | Discharge: 2018-05-30 | Payer: MEDICARE

## 2018-05-30 ENCOUNTER — Ambulatory Visit: Admit: 2018-05-30 | Discharge: 2018-05-31 | Payer: MEDICARE

## 2018-05-30 DIAGNOSIS — I255 Ischemic cardiomyopathy: Principal | ICD-10-CM

## 2018-05-30 DIAGNOSIS — I509 Heart failure, unspecified: ICD-10-CM

## 2018-06-01 ENCOUNTER — Encounter: Admit: 2018-06-01 | Discharge: 2018-06-01 | Payer: MEDICARE

## 2018-07-08 ENCOUNTER — Ambulatory Visit: Admit: 2018-07-07 | Discharge: 2018-07-08 | Payer: MEDICARE

## 2018-07-08 DIAGNOSIS — Z8679 Personal history of other diseases of the circulatory system: Secondary | ICD-10-CM

## 2018-07-08 DIAGNOSIS — I4901 Ventricular fibrillation: ICD-10-CM

## 2018-07-08 DIAGNOSIS — I255 Ischemic cardiomyopathy: ICD-10-CM

## 2018-07-08 DIAGNOSIS — I469 Cardiac arrest, cause unspecified: Principal | ICD-10-CM

## 2018-07-08 DIAGNOSIS — Z95810 Presence of automatic (implantable) cardiac defibrillator: Secondary | ICD-10-CM

## 2018-07-12 ENCOUNTER — Encounter: Admit: 2018-07-12 | Discharge: 2018-07-12 | Payer: MEDICARE

## 2018-07-12 DIAGNOSIS — I25118 Atherosclerotic heart disease of native coronary artery with other forms of angina pectoris: ICD-10-CM

## 2018-07-12 DIAGNOSIS — I5022 Chronic systolic (congestive) heart failure: ICD-10-CM

## 2018-07-12 DIAGNOSIS — I739 Peripheral vascular disease, unspecified: ICD-10-CM

## 2018-07-12 DIAGNOSIS — E782 Mixed hyperlipidemia: Principal | ICD-10-CM

## 2018-07-28 ENCOUNTER — Encounter: Admit: 2018-07-28 | Discharge: 2018-07-28 | Payer: MEDICARE

## 2018-07-28 ENCOUNTER — Ambulatory Visit: Admit: 2018-07-28 | Discharge: 2018-07-29 | Payer: MEDICARE

## 2018-07-28 DIAGNOSIS — I739 Peripheral vascular disease, unspecified: ICD-10-CM

## 2018-07-28 DIAGNOSIS — I469 Cardiac arrest, cause unspecified: ICD-10-CM

## 2018-07-28 DIAGNOSIS — I2510 Atherosclerotic heart disease of native coronary artery without angina pectoris: ICD-10-CM

## 2018-07-28 DIAGNOSIS — Z95810 Presence of automatic (implantable) cardiac defibrillator: ICD-10-CM

## 2018-07-28 DIAGNOSIS — E782 Mixed hyperlipidemia: ICD-10-CM

## 2018-07-28 DIAGNOSIS — R200 Anesthesia of skin: ICD-10-CM

## 2018-07-28 DIAGNOSIS — Z8679 Personal history of other diseases of the circulatory system: ICD-10-CM

## 2018-07-28 DIAGNOSIS — I219 Acute myocardial infarction, unspecified: ICD-10-CM

## 2018-07-28 DIAGNOSIS — M1990 Unspecified osteoarthritis, unspecified site: ICD-10-CM

## 2018-07-28 DIAGNOSIS — Z87891 Personal history of nicotine dependence: ICD-10-CM

## 2018-07-28 DIAGNOSIS — I25118 Atherosclerotic heart disease of native coronary artery with other forms of angina pectoris: ICD-10-CM

## 2018-07-28 DIAGNOSIS — Z9981 Dependence on supplemental oxygen: ICD-10-CM

## 2018-07-28 DIAGNOSIS — J95821 Acute postprocedural respiratory failure: ICD-10-CM

## 2018-07-28 DIAGNOSIS — I724 Aneurysm of artery of lower extremity: ICD-10-CM

## 2018-07-28 DIAGNOSIS — E785 Hyperlipidemia, unspecified: Principal | ICD-10-CM

## 2018-07-28 DIAGNOSIS — M109 Gout, unspecified: ICD-10-CM

## 2018-07-28 DIAGNOSIS — I5022 Chronic systolic (congestive) heart failure: ICD-10-CM

## 2018-07-28 DIAGNOSIS — I255 Ischemic cardiomyopathy: ICD-10-CM

## 2018-09-06 ENCOUNTER — Encounter: Admit: 2018-09-06 | Discharge: 2018-09-06 | Payer: MEDICARE

## 2018-10-06 ENCOUNTER — Ambulatory Visit: Admit: 2018-10-06 | Discharge: 2018-10-07 | Payer: MEDICARE

## 2018-10-07 DIAGNOSIS — I469 Cardiac arrest, cause unspecified: Secondary | ICD-10-CM

## 2018-10-07 DIAGNOSIS — Z8679 Personal history of other diseases of the circulatory system: Secondary | ICD-10-CM

## 2018-10-07 DIAGNOSIS — I255 Ischemic cardiomyopathy: Secondary | ICD-10-CM

## 2018-10-07 DIAGNOSIS — Z95810 Presence of automatic (implantable) cardiac defibrillator: Secondary | ICD-10-CM

## 2018-10-07 DIAGNOSIS — I4901 Ventricular fibrillation: Secondary | ICD-10-CM

## 2018-10-22 ENCOUNTER — Inpatient Hospital Stay: Admit: 2018-10-22 | Discharge: 2018-10-22 | Payer: MEDICARE

## 2018-10-22 ENCOUNTER — Encounter: Admit: 2018-10-22 | Discharge: 2018-10-22 | Payer: MEDICARE

## 2018-10-22 ENCOUNTER — Encounter: Admit: 2018-10-22 | Discharge: 2018-10-22

## 2018-10-22 DIAGNOSIS — I25118 Atherosclerotic heart disease of native coronary artery with other forms of angina pectoris: ICD-10-CM

## 2018-10-22 DIAGNOSIS — J189 Pneumonia, unspecified organism: Principal | ICD-10-CM

## 2018-10-22 LAB — COMPREHENSIVE METABOLIC PANEL
Lab: 0.7 mg/dL — ABNORMAL LOW (ref 0.3–1.2)
Lab: 1.1 mg/dL — ABNORMAL HIGH (ref 0.4–1.00)
Lab: 105 MMOL/L — ABNORMAL HIGH (ref 98–110)
Lab: 14 K/UL — ABNORMAL HIGH (ref 3–12)
Lab: 142 MMOL/L (ref 137–147)
Lab: 161 mg/dL — ABNORMAL HIGH (ref 70–100)
Lab: 17 U/L (ref 7–56)
Lab: 4.3 g/dL (ref 3.5–5.0)
Lab: 50 mL/min — ABNORMAL LOW (ref >60)
Lab: 7.2 g/dL — ABNORMAL HIGH (ref 6.0–8.0)
Lab: 8.9 mg/dL (ref 8.5–10.6)
Lab: 89 U/L (ref 25–110)

## 2018-10-22 LAB — URINALYSIS DIPSTICK
Lab: 1 /HPF (ref 1.003–1.035)
Lab: NEGATIVE
Lab: NEGATIVE
Lab: NEGATIVE
Lab: NEGATIVE
Lab: NEGATIVE

## 2018-10-22 LAB — CBC AND DIFF
Lab: 0 % (ref 0–2)
Lab: 0.1 K/UL (ref >60)
Lab: 17 10*3/uL — ABNORMAL HIGH (ref 4.5–11.0)

## 2018-10-22 LAB — BLOOD GASES, ARTERIAL
Lab: 1.3 MMOL/L
Lab: 152 mmHg — ABNORMAL HIGH (ref 80–100)
Lab: 23 MMOL/L (ref 21–28)
Lab: 39 mmHg (ref 35–45)
Lab: 7.3 (ref 7.35–7.45)
Lab: 99 % (ref 95–99)

## 2018-10-22 LAB — URINALYSIS, MICROSCOPIC

## 2018-10-22 LAB — RVP VIRAL PANEL PCR

## 2018-10-22 LAB — LACTIC ACID(LACTATE)
Lab: 1.3 MMOL/L (ref 0.5–2.0)
Lab: 1.5 MMOL/L (ref 0.5–2.0)

## 2018-10-22 LAB — TROPONIN-I
Lab: 0.3 ng/mL — ABNORMAL HIGH (ref 0.0–0.05)
Lab: 0.6 ng/mL — ABNORMAL HIGH (ref 0.0–0.05)

## 2018-10-22 LAB — PROTIME INR (PT): Lab: 1.1 g/dL (ref 0.8–1.2)

## 2018-10-22 LAB — PTT (APTT): Lab: 26 s (ref 24.0–36.5)

## 2018-10-22 LAB — MAGNESIUM: Lab: 1.8 mg/dL (ref 1.6–2.6)

## 2018-10-22 LAB — PROCALCITONIN: Lab: 3.1 ng/mL — ABNORMAL HIGH (ref <0.11)

## 2018-10-22 MED ORDER — NALOXONE 0.4 MG/ML IJ SOLN
.08 mg | INTRAVENOUS | 0 refills | Status: DC | PRN
Start: 2018-10-22 — End: 2018-10-28

## 2018-10-22 MED ORDER — SENNOSIDES 8.8 MG/5 ML PO SYRP
17.6 mg | Freq: Two times a day (BID) | ORAL | 0 refills | Status: DC
Start: 2018-10-22 — End: 2018-10-23
  Administered 2018-10-22 – 2018-10-23 (×2): 17.6 mg via ORAL

## 2018-10-22 MED ORDER — BUMETANIDE 0.25 MG/ML IJ SOLN
0.5 mg | Freq: Once | INTRAVENOUS | 0 refills | Status: CP
Start: 2018-10-22 — End: ?
  Administered 2018-10-22: 17:00:00 0.5 mg via INTRAVENOUS

## 2018-10-22 MED ORDER — PROPOFOL 10 MG/ML IV EMUL
5-150 ug/kg/min | INTRAVENOUS | 0 refills | Status: DC
Start: 2018-10-22 — End: 2018-10-24
  Administered 2018-10-22 – 2018-10-23 (×2): 10 ug/kg/min via INTRAVENOUS

## 2018-10-22 MED ORDER — PERFLUTREN LIPID MICROSPHERES 1.1 MG/ML IV SUSP
1-20 mL | Freq: Once | INTRAVENOUS | 0 refills | Status: AC | PRN
Start: 2018-10-22 — End: ?

## 2018-10-22 MED ORDER — VANCOMYCIN 1,250 MG IVPB
20 mg/kg | Freq: Once | INTRAVENOUS | 0 refills | Status: CP
Start: 2018-10-22 — End: ?
  Administered 2018-10-22 (×2): 1250 mg via INTRAVENOUS

## 2018-10-22 MED ORDER — BUMETANIDE 0.25 MG/ML IJ SOLN
0.5 mg | Freq: Once | INTRAVENOUS | 0 refills | Status: CP
Start: 2018-10-22 — End: ?
  Administered 2018-10-23: 01:00:00 0.5 mg via INTRAVENOUS

## 2018-10-22 MED ORDER — FENTANYL PCA/DRIP IN NS 1000MCG/100ML
10-100 ug/h | INTRAVENOUS | 0 refills | Status: DC
Start: 2018-10-22 — End: 2018-10-24
  Administered 2018-10-22: 14:00:00 25 ug/h via INTRAVENOUS

## 2018-10-22 MED ORDER — PANTOPRAZOLE(#) 2MG/ML PO SUSP
40 mg | Freq: Every day | ORAL | 0 refills | Status: DC
Start: 2018-10-22 — End: 2018-10-23
  Administered 2018-10-23: 03:00:00 40 mg via ORAL

## 2018-10-22 MED ORDER — VANCOMYCIN 1G/250ML D5W IVPB (VIAL2BAG)
15 mg/kg | Freq: Two times a day (BID) | INTRAVENOUS | 0 refills | Status: DC
Start: 2018-10-22 — End: 2018-10-24
  Administered 2018-10-23 – 2018-10-24 (×8): 1000 mg via INTRAVENOUS

## 2018-10-22 MED ORDER — CLOPIDOGREL 75 MG PO TAB
75 mg | Freq: Every day | ORAL | 0 refills | Status: DC
Start: 2018-10-22 — End: 2018-10-23
  Administered 2018-10-22: 17:00:00 75 mg via ORAL

## 2018-10-22 MED ORDER — METRONIDAZOLE IN NACL (ISO-OS) 500 MG/100 ML IV PGBK
500 mg | INTRAVENOUS | 0 refills | Status: DC
Start: 2018-10-22 — End: 2018-10-24
  Administered 2018-10-22 – 2018-10-24 (×5): 500 mg via INTRAVENOUS

## 2018-10-22 MED ORDER — CEFEPIME 1G/100ML NS IVPB (MB+)
1 g | INTRAVENOUS | 0 refills | Status: DC
Start: 2018-10-22 — End: 2018-10-24
  Administered 2018-10-22 – 2018-10-24 (×14): 1 g via INTRAVENOUS

## 2018-10-22 MED ORDER — SPIRONOLACTONE 25 MG PO TAB
12.5 mg | Freq: Every day | ORAL | 0 refills | Status: DC
Start: 2018-10-22 — End: 2018-10-23
  Administered 2018-10-22: 17:00:00 12.5 mg via ORAL

## 2018-10-22 MED ORDER — DOCUSATE SODIUM 50 MG/5 ML PO LIQD
100 mg | Freq: Two times a day (BID) | ORAL | 0 refills | Status: DC
Start: 2018-10-22 — End: 2018-10-23
  Administered 2018-10-22 – 2018-10-23 (×2): 100 mg via ORAL

## 2018-10-22 MED ORDER — ASPIRIN 81 MG PO CHEW
81 mg | Freq: Every day | ORAL | 0 refills | Status: DC
Start: 2018-10-22 — End: 2018-10-23
  Administered 2018-10-22: 17:00:00 81 mg via ORAL

## 2018-10-22 MED ORDER — CHLORHEXIDINE GLUCONATE 0.12 % MM MWSH
15 mL | Freq: Two times a day (BID) | 0 refills | Status: DC
Start: 2018-10-22 — End: 2018-10-24
  Administered 2018-10-22 – 2018-10-23 (×3): 15 mL

## 2018-10-22 MED ORDER — VANCOMYCIN PHARMACY TO MANAGE
1 | 0 refills | Status: DC
Start: 2018-10-22 — End: 2018-10-24

## 2018-10-22 MED ORDER — ATORVASTATIN 40 MG PO TAB
40 mg | Freq: Every day | ORAL | 0 refills | Status: DC
Start: 2018-10-22 — End: 2018-10-23
  Administered 2018-10-22: 17:00:00 40 mg via ORAL

## 2018-10-22 MED ORDER — PANTOPRAZOLE 40 MG PO TBEC
40 mg | Freq: Every day | ORAL | 0 refills | Status: DC
Start: 2018-10-22 — End: 2018-10-23

## 2018-10-22 NOTE — Progress Notes
Critical Care Progress Note      Today's Date:  10/22/2018  Name:  Brittany Mccormick                       MRN:  [id number]   Admission Date: 10/22/2018  LOS: 0 days                     Assessment/Plan:   Active Problems:    Acute respiratory failure with hypoxia [facility])        Brittany Mccormick is a 63yo WF with PMH CAD s/p NSTEMI in 01/2014 and 02/2018, 4-vessel CABG in 08/2002, with multiple stents (>9) placed, HLD, COPD, PVD s/p several stents and lower extremity bypass who presents with a V-fib arrest. Most recent stents placed in 03/2018. Admitted to CICU as transfer from OSH after calling EMS for SOB and subsequently being intubated in ED of receiving hospital.     Neuro: PRN pain meds as needed; propofol and fentanyl for sedation while on mechanical ventilation.   Cardiac: Hx of 4 vessel CABG and numerous DES, most recent in July 2019. On DAPT. Bedside TTE with reduced Fx from baseline but not overly distended. RV depressed but not dilated. Mild trop leak. Formal TTE pending.   Pulmonary: Mechanically ventilated on arrival. ABG pending. Concern for mixed etiology of respiratory distress with mild volume overload and infectious PNA.   FEN: NPO  ID:  Cultures sent. Received rocephin at OSH which may cloud results. WBC ~21 on arrival to ED. Vanc/Cefepime/Flagyl for empiric coverage. Will check flu and RVP although no Hx of high fevers at this time.   Renal:  AKI on admission. Will likely need gentle diuresis. IVC not overly distended on bedside TTE. BNP ~800.  Heme:  Stable. Leukocytosis.   Endo:  Modified Yale Insulin Protocol  Prophylaxis: HOB>40, PPI, DVT prophylaxis per primary team  Disposition/Family: Needs ICU for respiratory failure on mechanical ventilation.       Prophylaxis Review:  Lines:  None  Urinary Catheter:  Yes; Retain foley due to:  Need for accurate Intake and Output  Antibiotic Usage:  Yes; Infection present or suspected:  Lung;  Pneumonia  VTE: Per Primary

## 2018-10-23 DIAGNOSIS — J189 Pneumonia, unspecified organism: Principal | ICD-10-CM

## 2018-10-23 LAB — COMPREHENSIVE METABOLIC PANEL
Lab: 0.9 mg/dL (ref 0.3–1.2)
Lab: 1 mg/dL — ABNORMAL HIGH (ref 0.4–1.00)
Lab: 10 (ref 3–12)
Lab: 14 U/L (ref 7–56)
Lab: 141 MMOL/L (ref 137–147)
Lab: 24 MMOL/L (ref 21–30)
Lab: 4.5 MMOL/L (ref 3.5–5.1)
Lab: 54 mL/min — ABNORMAL LOW (ref >60)
Lab: 95 mg/dL — ABNORMAL LOW (ref 70–100)
Lab: >60 mL/min (ref >60)

## 2018-10-23 LAB — LACTIC ACID(LACTATE)
Lab: 1.4 MMOL/L (ref 0.5–2.0)
Lab: 1 MMOL/L (ref 0.5–2.0)

## 2018-10-23 LAB — GRAM STAIN

## 2018-10-23 LAB — CBC
Lab: 10 FL (ref 7–11)
Lab: 10 K/UL (ref 4.5–11.0)
Lab: 105 K/UL — ABNORMAL LOW (ref 150–400)
Lab: 14 % (ref 11–15)
Lab: 31 pg (ref 26–34)
Lab: 94 FL — ABNORMAL LOW (ref 80–100)

## 2018-10-23 LAB — TROPONIN-I
Lab: 0.4 ng/mL — ABNORMAL HIGH (ref 0.0–0.05)
Lab: 0.2 ng/mL — ABNORMAL HIGH (ref 0.0–0.05)

## 2018-10-23 LAB — VANCOMYCIN TROUGH: Lab: 13 ug/mL (ref 10.0–20.0)

## 2018-10-23 LAB — MAGNESIUM: Lab: 1.8 mg/dL — ABNORMAL LOW (ref 1.6–2.6)

## 2018-10-23 MED ORDER — BUMETANIDE 0.25 MG/ML IJ SOLN
0.5 mg | Freq: Once | INTRAVENOUS | 0 refills | Status: CP
Start: 2018-10-23 — End: ?
  Administered 2018-10-23: 08:00:00 0.5 mg via INTRAVENOUS

## 2018-10-23 MED ORDER — MAGNESIUM SULFATE IN D5W 1 GRAM/100 ML IV PGBK
1 g | INTRAVENOUS | 0 refills | Status: CP
Start: 2018-10-23 — End: ?
  Administered 2018-10-23 (×2): 1 g via INTRAVENOUS

## 2018-10-23 MED ORDER — METRONIDAZOLE 500 MG PO TAB
500 mg | Freq: Three times a day (TID) | ORAL | 0 refills | Status: DC
Start: 2018-10-23 — End: 2018-10-24
  Administered 2018-10-24: 16:00:00 500 mg via ORAL

## 2018-10-23 MED ORDER — TIOTROPIUM BROMIDE 18 MCG IN CPDV
1 | Freq: Every day | RESPIRATORY_TRACT | 0 refills | Status: DC
Start: 2018-10-23 — End: 2018-10-26
  Administered 2018-10-24: 19:00:00 1 via RESPIRATORY_TRACT

## 2018-10-23 MED ORDER — PANTOPRAZOLE(#) 2MG/ML PO SUSP
40 mg | Freq: Every day | OROGASTRIC | 0 refills | Status: DC
Start: 2018-10-23 — End: 2018-10-24

## 2018-10-23 MED ORDER — ASPIRIN 81 MG PO CHEW
81 mg | Freq: Every day | OROGASTRIC | 0 refills | Status: DC
Start: 2018-10-23 — End: 2018-10-28
  Administered 2018-10-23 – 2018-10-28 (×5): 81 mg via OROGASTRIC

## 2018-10-23 MED ORDER — SENNOSIDES 8.8 MG/5 ML PO SYRP
17.6 mg | Freq: Two times a day (BID) | OROGASTRIC | 0 refills | Status: DC
Start: 2018-10-23 — End: 2018-10-24
  Administered 2018-10-23: 15:00:00 17.6 mg via OROGASTRIC

## 2018-10-23 MED ORDER — ATORVASTATIN 40 MG PO TAB
40 mg | Freq: Every day | OROGASTRIC | 0 refills | Status: DC
Start: 2018-10-23 — End: 2018-10-28
  Administered 2018-10-23 – 2018-10-28 (×6): 40 mg via OROGASTRIC

## 2018-10-23 MED ORDER — ALBUTEROL SULFATE 90 MCG/ACTUATION IN HFAA
2 | RESPIRATORY_TRACT | 0 refills | Status: DC | PRN
Start: 2018-10-23 — End: 2018-10-25

## 2018-10-23 MED ORDER — CLOPIDOGREL 75 MG PO TAB
75 mg | Freq: Every day | OROGASTRIC | 0 refills | Status: DC
Start: 2018-10-23 — End: 2018-10-28
  Administered 2018-10-23 – 2018-10-28 (×6): 75 mg via OROGASTRIC

## 2018-10-23 MED ORDER — SENNOSIDES-DOCUSATE SODIUM 8.6-50 MG PO TAB
2 | Freq: Two times a day (BID) | ORAL | 0 refills | Status: DC
Start: 2018-10-23 — End: 2018-10-28
  Administered 2018-10-24 – 2018-10-28 (×6): 2 via ORAL

## 2018-10-23 MED ORDER — HEPARIN, PORCINE (PF) 5,000 UNIT/0.5 ML IJ SYRG
5000 [IU] | SUBCUTANEOUS | 0 refills | Status: DC
Start: 2018-10-23 — End: 2018-10-28
  Administered 2018-10-24 – 2018-10-27 (×11): 5000 [IU] via SUBCUTANEOUS

## 2018-10-23 MED ORDER — PANTOPRAZOLE 40 MG PO TBEC
40 mg | Freq: Every day | ORAL | 0 refills | Status: DC
Start: 2018-10-23 — End: 2018-10-28
  Administered 2018-10-24 – 2018-10-28 (×5): 40 mg via ORAL

## 2018-10-23 MED ORDER — DOCUSATE SODIUM 50 MG/5 ML PO LIQD
100 mg | Freq: Two times a day (BID) | OROGASTRIC | 0 refills | Status: DC
Start: 2018-10-23 — End: 2018-10-24
  Administered 2018-10-23: 15:00:00 100 mg via OROGASTRIC

## 2018-10-23 MED ORDER — SENNOSIDES 8.6 MG PO TAB
2 | Freq: Two times a day (BID) | ORAL | 0 refills | Status: DC
Start: 2018-10-23 — End: 2018-10-24

## 2018-10-24 LAB — BASIC METABOLIC PANEL
Lab: 10 mg/dL — ABNORMAL HIGH (ref 7–25)
Lab: 106 MMOL/L — ABNORMAL LOW (ref 98–110)
Lab: 98 mg/dL (ref 70–100)
Lab: 20 MMOL/L — ABNORMAL LOW (ref >60)
Lab: 4 MMOL/L (ref 3.5–5.1)

## 2018-10-24 LAB — CULTURE-RESP,LOWER W/SENSITIVITY: Lab: LOW

## 2018-10-24 LAB — PROCALCITONIN: Lab: 0 ng/mL (ref <0.11)

## 2018-10-24 LAB — CBC
Lab: 10 FL (ref 7–11)
Lab: 10 g/dL — ABNORMAL LOW (ref 12.0–15.0)
Lab: 14 % (ref 11–15)
Lab: 3.3 M/UL — ABNORMAL LOW (ref 4.0–5.0)
Lab: 31 % — ABNORMAL LOW (ref 36–45)
Lab: 31 pg (ref 26–34)
Lab: 33 g/dL (ref 32.0–36.0)
Lab: 8.1 10*3/uL (ref 4.5–11.0)
Lab: 89 10*3/uL — ABNORMAL LOW (ref 150–400)
Lab: 94 FL (ref 80–100)

## 2018-10-24 MED ORDER — MELATONIN 5 MG PO TAB
5 mg | Freq: Every evening | ORAL | 0 refills | Status: DC | PRN
Start: 2018-10-24 — End: 2018-10-28
  Administered 2018-10-24 – 2018-10-27 (×2): 5 mg via ORAL

## 2018-10-24 MED ORDER — ACETAMINOPHEN 325 MG PO TAB
650 mg | ORAL | 0 refills | Status: DC | PRN
Start: 2018-10-24 — End: 2018-10-24
  Administered 2018-10-24: 16:00:00 650 mg via ORAL

## 2018-10-24 MED ORDER — POTASSIUM CHLORIDE IN WATER 10 MEQ/50 ML IV PGBK
10 meq | INTRAVENOUS | 0 refills | Status: DC
Start: 2018-10-24 — End: 2018-10-24

## 2018-10-24 MED ORDER — POTASSIUM CHLORIDE 20 MEQ PO TBTQ
60 meq | Freq: Once | ORAL | 0 refills | Status: DC
Start: 2018-10-24 — End: 2018-10-24

## 2018-10-24 MED ORDER — POTASSIUM CHLORIDE 20 MEQ PO TBTQ
80 meq | Freq: Once | ORAL | 0 refills | Status: CP
Start: 2018-10-24 — End: ?
  Administered 2018-10-24: 11:00:00 80 meq via ORAL

## 2018-10-24 MED ORDER — PERFLUTREN LIPID MICROSPHERES 1.1 MG/ML IV SUSP
1-20 mL | Freq: Once | INTRAVENOUS | 0 refills | Status: CP | PRN
Start: 2018-10-24 — End: ?
  Administered 2018-10-24: 16:00:00 2.5 mL via INTRAVENOUS

## 2018-10-24 MED ORDER — MAGNESIUM SULFATE IN D5W 1 GRAM/100 ML IV PGBK
1 g | Freq: Once | INTRAVENOUS | 0 refills | Status: CP
Start: 2018-10-24 — End: ?
  Administered 2018-10-24: 11:00:00 1 g via INTRAVENOUS

## 2018-10-24 MED ORDER — ACETAMINOPHEN 325 MG PO TAB
650 mg | Freq: Once | ORAL | 0 refills | Status: AC
Start: 2018-10-24 — End: ?

## 2018-10-24 NOTE — Progress Notes
0500: Pt had 7 beat run of NSVT. Dr. Jacques Navy notified. Labs drawn and electrolytes replaced.

## 2018-10-24 NOTE — Case Management (ED)
Case Management Admission Assessment    NAME:Brittany Mccormick                          MRN: [id number]             DOB:Mar 26, 1956          AGE: 63 y.o.  ADMISSION DATE: 10/22/2018             DAYS ADMITTED: LOS: 2 days      Today???s Date: 10/24/2018  ???  Source of Information: patient and EMR    ???  Plan  Plan: Case Management Assessment, Discharge Planning for Home Anticipated, Assist PRN with SW/NCM Services   *This CM met with pt for assessment on this date.  Provided contact information and explanation of SW/NCM roles.  Reviewed [facility], Preparing for Discharge, and Preferred Provider Network hand-outs.  Provided opportunity for questions and discussion. Pt/family encouraged to contact Case Management team with questions and concerns during hospitalization and until patient is able to transition back to the patient's primary care physician.  *The CM team will follow patient through course of stay and assist with discharge planning.   *Patient stated she has not been able to get a ramp for her house but is doing okay getting in and out of her home.   *Patient is no longer on oxygen at home. Patient used to use [employer] phone: [phone] for her home oxygen.   *Patient gets 10 hours of HCBS a week provided by her daughter and paid for by Medicaid. Patient was wondering if she could get more help and this RN CM recommended to patient that she contact Medicaid.   *Patient states she smokes marijuana 1 x week and no longer smokes cigarettes (quite 02/2018).   *Patient's daughter will drive her home at discharge.   ???  Patient Address/Phone  [address]  [place] [address]  [phone] (home)   ???  Emergency Contact  Extended Emergency Contact Information  Primary Emergency Contact: Brittany Mccormick [address] [place]  Home Phone: [phone]  Relation: Sister  Secondary Emergency Contact: Brittany Mccormick [place]  Home Phone: [phone]  Relation: Daughter  ??? [facility] - [place], [place] - [address]  [address] [place] [address]  Phone: [phone] Fax: [phone]  ???  [facility] - [place], [place] - [address]  [address]  [place] [address]  Phone: [phone] Fax: [phone]  ???  ??? Durable Medical Equipment   Durable Medical Equipment at home: Rollator  ??? Home Health  Receiving home health: Yes  Agency name: [facility] insurance  Would patient use this agency again?: Yes  ??? Hemodialysis or Peritoneal Dialysis  Undergoing hemodialysis or peritoneal dialysis: No  ??? Tube/Enteral Feeds  Receive tube/enteral feeds: No  ??? Infusion  Receive infusions: No  ??? Private Duty  Private duty help used: No  ??? Home and Community Based Services  Home and community based services: No  ??? Ryan White  Ryan White: N/A  ??? Hospice  Hospice: No  ??? Outpatient Therapy  PT: No  OT: No  SLP: No  ??? Skilled Nursing Facility/Nursing Home  SNF: No  NH: No  ??? Inpatient Rehab  IPR: No  ??? Long-Term Acute Care Hospital  LTACH: No  ? Acute Hospital Stay  Acute Hospital Stay: In the past  Was patient's stay within the last 30 days?: No    Verdie Shire RN, BSN, MSN  Integrated Nursing Case Manager  Office [phone] M-F 8-5 pm

## 2018-10-25 ENCOUNTER — Encounter: Admit: 2018-10-25 | Discharge: 2018-10-25 | Payer: MEDICARE

## 2018-10-25 LAB — POC BLOOD GAS ARTERIAL
Lab: 11 MMOL/L
Lab: 17 MMOL/L — ABNORMAL LOW (ref 21–28)
Lab: 44 mmHg (ref 35–45)
Lab: 96 % (ref 95–99)
Lab: 96 mmHg (ref 80–100)
Lab: 7.2 — ABNORMAL LOW (ref 7.35–7.45)

## 2018-10-25 LAB — POC BLOOD GAS VEN

## 2018-10-25 LAB — BASIC METABOLIC PANEL
Lab: 0.9 mg/dL — ABNORMAL LOW (ref 0.4–1.00)
Lab: >60 mL/min (ref >60)
Lab: >60 mL/min — ABNORMAL HIGH (ref >60)
Lab: 0.8 mg/dL (ref 0.4–1.00)
Lab: 12 % (ref 3–12)
Lab: 26 MMOL/L (ref 21–30)
Lab: 3.4 MMOL/L — ABNORMAL LOW (ref >60)
Lab: 8.9 mg/dL (ref 8.5–10.6)
Lab: 85 mg/dL (ref 70–100)
Lab: 9 mg/dL — ABNORMAL LOW (ref 7–25)
Lab: >60 mL/min (ref >60)
Lab: >60 mL/min (ref >60)
Lab: 103 MMOL/L (ref 98–110)
Lab: 13 g/dL — ABNORMAL HIGH (ref 3–12)
Lab: 141 MMOL/L (ref 137–147)
Lab: 25 MMOL/L (ref 21–30)
Lab: 3.7 MMOL/L (ref 3.5–5.1)
Lab: 8 mg/dL — ABNORMAL LOW (ref 7–25)
Lab: >60 mL/min (ref >60)

## 2018-10-25 LAB — BNP (B-TYPE NATRIURETIC PEPTI): Lab: 142 pg/mL — ABNORMAL HIGH (ref 0–100)

## 2018-10-25 LAB — CBC AND DIFF
Lab: 17 K/UL — ABNORMAL HIGH (ref >60)
Lab: 31 pg — ABNORMAL LOW (ref 26–34)
Lab: 38 % — ABNORMAL LOW (ref 36–45)
Lab: 68 % — ABNORMAL HIGH (ref >60)

## 2018-10-25 LAB — LACTIC ACID(LACTATE): Lab: 3.1 MMOL/L — ABNORMAL HIGH (ref 0.5–2.0)

## 2018-10-25 LAB — TROPONIN-I: Lab: 0 ng/mL — ABNORMAL HIGH (ref 0.0–0.05)

## 2018-10-25 MED ORDER — BUMETANIDE 0.25MG/ML IV DRIP (STD CONC)
.5 mg/h | INTRAVENOUS | 0 refills | Status: DC
Start: 2018-10-25 — End: 2018-10-27
  Administered 2018-10-25 – 2018-10-27 (×3): 0.5 mg/h via INTRAVENOUS

## 2018-10-25 MED ORDER — POTASSIUM CHLORIDE 20 MEQ PO TBTQ
40 meq | Freq: Once | ORAL | 0 refills | Status: CP
Start: 2018-10-25 — End: ?
  Administered 2018-10-25: 23:00:00 40 meq via ORAL

## 2018-10-25 MED ORDER — ALBUTEROL SULFATE 2.5 MG/0.5 ML IN NEBU
2.5 mg | RESPIRATORY_TRACT | 0 refills | Status: DC | PRN
Start: 2018-10-25 — End: 2018-10-25
  Administered 2018-10-25 (×2): 2.5 mg via RESPIRATORY_TRACT

## 2018-10-25 MED ORDER — PREGABALIN 50 MG PO CAP
100 mg | Freq: Three times a day (TID) | ORAL | 0 refills | Status: DC
Start: 2018-10-25 — End: 2018-10-28
  Administered 2018-10-25 – 2018-10-28 (×9): 100 mg via ORAL

## 2018-10-25 MED ORDER — POTASSIUM CHLORIDE IN WATER 10 MEQ/50 ML IV PGBK
10 meq | INTRAVENOUS | 0 refills | Status: DC
Start: 2018-10-25 — End: 2018-10-25

## 2018-10-25 MED ORDER — BUMETANIDE 0.25 MG/ML IJ SOLN
1 mg | Freq: Once | INTRAVENOUS | 0 refills | Status: CP
Start: 2018-10-25 — End: ?
  Administered 2018-10-25: 15:00:00 1 mg via INTRAVENOUS

## 2018-10-25 MED ORDER — BUMETANIDE 0.5 MG PO TAB
1 mg | Freq: Two times a day (BID) | ORAL | 0 refills | Status: DC
Start: 2018-10-25 — End: 2018-10-25

## 2018-10-25 MED ORDER — COLCHICINE 0.6 MG PO TAB
0.6 mg | Freq: Every day | ORAL | 0 refills | Status: DC
Start: 2018-10-25 — End: 2018-10-26

## 2018-10-25 MED ORDER — SPIRONOLACTONE 25 MG PO TAB
12.5 mg | Freq: Every day | ORAL | 0 refills | Status: DC
Start: 2018-10-25 — End: 2018-10-28
  Administered 2018-10-25 – 2018-10-28 (×4): 12.5 mg via ORAL

## 2018-10-25 MED ORDER — BUMETANIDE 0.25 MG/ML IJ SOLN
1 mg | Freq: Once | INTRAVENOUS | 0 refills | Status: CP
Start: 2018-10-25 — End: ?
  Administered 2018-10-25: 09:00:00 1 mg via INTRAVENOUS

## 2018-10-25 MED ORDER — POTASSIUM CHLORIDE 20 MEQ PO TBTQ
40 meq | Freq: Once | ORAL | 0 refills | Status: CP
Start: 2018-10-25 — End: ?
  Administered 2018-10-26: 01:00:00 40 meq via ORAL

## 2018-10-25 MED ORDER — IPRATROPIUM BROMIDE 0.02 % IN SOLN
.5 mg | Freq: Once | RESPIRATORY_TRACT | 0 refills | Status: CP
Start: 2018-10-25 — End: ?
  Administered 2018-10-25: 08:00:00 0.5 mg via RESPIRATORY_TRACT

## 2018-10-25 MED ORDER — IPRATROPIUM BROMIDE 0.02 % IN SOLN
.5 mg | RESPIRATORY_TRACT | 0 refills | Status: DC | PRN
Start: 2018-10-25 — End: 2018-10-26
  Administered 2018-10-25 – 2018-10-26 (×6): 0.5 mg via RESPIRATORY_TRACT

## 2018-10-25 MED ORDER — ALBUTEROL SULFATE 2.5 MG/0.5 ML IN NEBU
2.5 mg | Freq: Once | RESPIRATORY_TRACT | 0 refills | Status: CP
Start: 2018-10-25 — End: ?
  Administered 2018-10-25: 08:00:00 2.5 mg via RESPIRATORY_TRACT

## 2018-10-25 MED ORDER — BUMETANIDE (BUMEX) BOLUS FOR CONTINUOUS INFUSION
1 mg | Freq: Once | INTRAVENOUS | 0 refills | Status: CP
Start: 2018-10-25 — End: ?

## 2018-10-25 MED ORDER — ALBUTEROL SULFATE 2.5 MG/0.5 ML IN NEBU
2.5 mg | RESPIRATORY_TRACT | 0 refills | Status: DC | PRN
Start: 2018-10-25 — End: 2018-10-26
  Administered 2018-10-25 – 2018-10-26 (×6): 2.5 mg via RESPIRATORY_TRACT

## 2018-10-25 MED ORDER — COLCHICINE 0.6 MG PO TAB
0.6 mg | Freq: Every day | ORAL | 0 refills | Status: DC
Start: 2018-10-25 — End: 2018-10-26
  Administered 2018-10-26 (×2): 0.6 mg via ORAL

## 2018-10-25 NOTE — Progress Notes
Brittany Mccormick  Today's Date:  10/25/2018  Admission Date: 10/22/2018  LOS: 3 days      Active Problems:    CAD (coronary artery disease), native coronary artery    Hyperlipidemia    Peripheral vascular disease (HCC)    Hx of CABG    Pulmonary edema    ICD (implantable cardioverter-defibrillator), dual, in situ    Chronic systolic heart failure (HCC)    COPD (chronic obstructive pulmonary disease) (HCC)    Acute respiratory failure with hypoxia (HCC)    Leukocytosis    NSVT (nonsustained ventricular tachycardia) (HCC)    AKI (acute kidney injury) (HCC)      Brittany Mccormick is a 63 y.o. female with PMH CAD s/p NSTEMI in 01/2014 and 02/2018, 4-vessel CABG in 08/2002, with multiple stents (>9) placed, HLD, COPD, PVD s/p several stents and lower extremity bypass who presents with a V-fib arrest. Most recent stents placed in 03/2018. Admitted to CICU as transfer from OSH after calling EMS for SOB and subsequently being intubated in ED of receiving hospital.   ???    Assessment/Plan:      Neuro: Very anxious. MAE, follows commands. Maintain sleep wake cycle. Can use Melatonin as needed. PTA lyrica currently on hold.      Cardiac:  HFrEF 35%. Currently on ASA, atorva, plavix. Currently holding PTA metoprolol XL. Had planned for LHC on 2/4, however, patient now on BiPap after flash pulmonary edema, unable to lay flat. Diuresing with bumex IV.     Respiratory: Acute hypoxic respiratory failure 2/2 flash pulmonary edema. Currently on BiPap, started at 15/5 100%. We have since been able to wean to 60%. Wean to NC as able to 40%. ABG 7.2/44/98 on NRB prior to BiPap administration. CXR showed diffuse pulmonary edema. (had been intubated on 2/1 at OSH, extubated on 2/2 here)     GI: Currently on pantoprazole.      Heme: Hgb 12.7 plts 143 VTE PPX with SQ Heparin INR 1.1      ID: Afebrile. WBC 17.4. Lactic Acid 3.1 Culture with fever.  Blood, sputum cultures, UA with reflex - all with no growth to date, procal negative here. CXR pulmonary edema. Vanc, cefepime, and flagyl were on for 2 days, DCd now.      Renal: BUN/Cr  8/0.9  Diuresis as above     Endocrine:  Glucose on chemistry 181  SSI or insulin gtt as indicated.      FEN: Magnesium goal >2.0, i-Cal goal > 1.0, Potassium goal >4.0 mEq/L     Prophylaxis Review:   A)GI: PPI/H2Blocker - pantoprazole   B) Lines: none  C) Urinary Catheter:  Yes - 2/4  D) Antibiotic Usage:  No  E) VTE: Heparin SQ  F) Isolation: no  I) Restraints: Patient assessed for need for restraints.   Disposition/Family: upgraded to ICU status overnight    Primary service: Cardiology     Consults:  None        Subjective:       REVIEW OF SYSTEMS:   Constitutional: positive for anxiety  Respiratory: positive for increased work of breathing, wheezing, dyspnea on exertion or increased O2 requirement         Objective:        Medications:  Scheduled Meds:acetaminophen (TYLENOL) tablet 650 mg, 650 mg, Oral, ONCE  albuterol 0.5% (PROVENTIL) nebulizer solution 2.5 mg, 2.5 mg, Inhalation, Q4H & PRN  aspirin chewable tablet 81 mg, 81 mg, Per OG Tube, QDAY  atorvastatin (LIPITOR) tablet 40 mg, 40 mg, Per OG Tube, QDAY  clopiDOGrel (PLAVIX) tablet 75 mg, 75 mg, Per OG Tube, QDAY  heparin (porcine) PF syringe 5,000 Units, 5,000 Units, Subcutaneous, Q8H  pantoprazole DR (PROTONIX) tablet 40 mg, 40 mg, Oral, QDAY(21)  senna/docusate (SENOKOT-S) tablet 2 tablet, 2 tablet, Oral, BID  tiotropium (SPIRIVA) capsule for inhaler 1 capsule, 1 capsule, Inhalation, QDAY    Continuous Infusions:  PRN and Respiratory Meds:melatonin QHS PRN, naloxone PRN                       Vital Signs: Last Filed                  Vital Signs: 24 Hour Range   BP: 140/70 (02/04 0400)  Temp: 36.6 ???C (97.9 ???F) (02/04 0000)  Pulse: 111 (02/04 0400)  Respirations: 33 PER MINUTE (02/04 0400)  SpO2: 99 % (02/04 0400)  SpO2 Pulse: 111 (02/04 0400)  Height: 152.4 cm (5') (02/03 0931) BP: (91-154)/(52-89)   Temp:  [36.5 ???C (97.7 ???F)-36.8 ???C (98.3 ???F)] the ICU team.  This patient remains critically ill with acute hypoxic respiratory failure and is at high risk for morbidity and mortality. I have spent 45 minutes of critical care time including repeated evaluation and examination of patient throughout the shift, including: review of hemodynamics, imaging, laboratory data, NIV management, hemodynamic monitoring and management, lab and radiology review, medication review and management, fluid and electrolyte management, and other pertinent records in the ICU, as well as coordination of care with consulting teams.    Norva Pavlov, APRN-C   CICU  Pager [phone]  10/25/2018

## 2018-10-25 NOTE — Progress Notes
Heme:  Stable. Leukocytosis improved. DAPT as above.   Endo:  Insulin per SSI.  Prophylaxis: HOB>40, PPI, DVT prophylaxis per primary team  Disposition/Family: Needs ICU for respiratory failure on BiPAP.       Prophylaxis Review:  Lines:  None  Urinary Catheter:  Yes; Retain foley due to:  Need for accurate Intake and Output  Antibiotic Usage:  Yes; Infection present or suspected:  Empiric Abx  VTE: Per Primary  _____________________________________________________________________________    I have seen, examined and reviewed data concerning this patient.  I discussed the findings and plan of care with the ICU team. I spent 42 minutes in critical care time, excluding procedures today.    Windell Moment, MD  Assistant Professor  Anesthesiology/Critical Care Medicine  Pager: [phone]

## 2018-10-25 NOTE — Care Plan
Patient educated on plan of care, FiO2 weaning. Questions answered. Verbalized understanding.

## 2018-10-26 ENCOUNTER — Inpatient Hospital Stay: Admit: 2018-10-26 | Discharge: 2018-10-26 | Payer: MEDICARE

## 2018-10-26 DIAGNOSIS — J189 Pneumonia, unspecified organism: Principal | ICD-10-CM

## 2018-10-26 LAB — BASIC METABOLIC PANEL
Lab: 100 mg/dL — ABNORMAL HIGH (ref >60)
Lab: 139 MMOL/L — ABNORMAL LOW (ref 137–147)
Lab: 25 MMOL/L — ABNORMAL HIGH (ref 21–30)
Lab: 4.4 MMOL/L (ref 3.5–5.1)

## 2018-10-26 MED ORDER — MAGNESIUM SULFATE IN WATER 4 GRAM/50 ML (8 %) IV PGBK
4 g | Freq: Once | INTRAVENOUS | 0 refills | Status: CP
Start: 2018-10-26 — End: ?
  Administered 2018-10-26: 11:00:00 4 g via INTRAVENOUS

## 2018-10-26 MED ORDER — ACETAMINOPHEN 325 MG PO TAB
650 mg | ORAL | 0 refills | Status: DC | PRN
Start: 2018-10-26 — End: 2018-10-28
  Administered 2018-10-26 – 2018-10-27 (×2): 650 mg via ORAL

## 2018-10-26 MED ORDER — IPRATROPIUM BROMIDE 0.02 % IN SOLN
.5 mg | RESPIRATORY_TRACT | 0 refills | Status: DC | PRN
Start: 2018-10-26 — End: 2018-10-27
  Administered 2018-10-26 – 2018-10-27 (×5): 0.5 mg via RESPIRATORY_TRACT

## 2018-10-26 MED ORDER — ALBUTEROL SULFATE 2.5 MG/0.5 ML IN NEBU
2.5 mg | Freq: Four times a day (QID) | RESPIRATORY_TRACT | 0 refills | Status: DC | PRN
Start: 2018-10-26 — End: 2018-10-27
  Administered 2018-10-26 – 2018-10-27 (×5): 2.5 mg via RESPIRATORY_TRACT

## 2018-10-26 NOTE — Progress Notes
Moving from lying on your back to sitting on the side of a flatbed without using bedrails : A Little  Moving to and from a bed to a chair (including a wheelchair): A Little  Standing up from a chair using your arms (e.g. wheelchair, or bedside chair): A Little  To walk in hospital room: A Little  Climbing 3-5 steps with a railing: A Little  Raw Score: 19  Standardized (T-scale) Score: 42.48  Basic Mobility CMS 0-100%: 36.99  CMS G Code Modifier for Basic Mobility: CJ    Goals  Goal Formulation: With Patient  Time For Goal Achievement: 7 days  Patient Will Ambulate: Greater than 200 Feet, w/ Walker, w/ Stand By Assist  Patient Will Go Up / Down Stairs: 3-5 Stairs, w/ Stand By Assist    Plan  Treatment Interventions: Mobility Training;Balance Activities;Endurance Training  Plan Frequency: 3-5 Days per Week  PT Plan for Next Visit: progress ambulation and practice stairs     PT Discharge Recommendations  Recommendation: Home/prior living situation  Patient Currently Requires Equipment: Owns what is needed    Therapist  Lynett Grimes, PT, DPT   Date  10/26/2018

## 2018-10-27 ENCOUNTER — Encounter: Admit: 2018-10-27 | Discharge: 2018-10-27 | Payer: MEDICARE

## 2018-10-27 LAB — MAGNESIUM
Lab: 2.2 mg/dL (ref 1.6–2.6)
Lab: 2 mg/dL — ABNORMAL HIGH (ref 1.6–2.6)

## 2018-10-27 LAB — BASIC METABOLIC PANEL
Lab: 1 mg/dL — ABNORMAL HIGH (ref 0.4–1.00)
Lab: 117 mg/dL — ABNORMAL HIGH (ref 70–100)
Lab: 136 MMOL/L — ABNORMAL LOW (ref 137–147)
Lab: 16 mg/dL (ref 7–25)
Lab: 29 MMOL/L (ref 21–30)
Lab: 9.3 mg/dL (ref 8.5–10.6)
Lab: 96 MMOL/L — ABNORMAL LOW (ref 98–110)
Lab: >60 mL/min (ref >60)
Lab: 11 (ref 3–12)
Lab: 136 MMOL/L — ABNORMAL LOW (ref 137–147)
Lab: 4.2 MMOL/L — ABNORMAL LOW (ref >60)

## 2018-10-27 LAB — CBC AND DIFF
Lab: 14 % — ABNORMAL HIGH (ref 11–15)
Lab: 16 % — ABNORMAL HIGH (ref 4–12)

## 2018-10-27 LAB — POC GLUCOSE: Lab: 101 mg/dL — ABNORMAL HIGH (ref 70–100)

## 2018-10-27 MED ORDER — ASPIRIN 81 MG PO CHEW
243 mg | Freq: Once | ORAL | 0 refills | Status: DC
Start: 2018-10-27 — End: 2018-10-28

## 2018-10-27 MED ORDER — IPRATROPIUM BROMIDE 0.02 % IN SOLN
.5 mg | Freq: Four times a day (QID) | RESPIRATORY_TRACT | 0 refills | Status: DC | PRN
Start: 2018-10-27 — End: 2018-10-28
  Administered 2018-10-27 – 2018-10-28 (×5): 0.5 mg via RESPIRATORY_TRACT

## 2018-10-27 MED ORDER — ONDANSETRON HCL (PF) 4 MG/2 ML IJ SOLN
4 mg | INTRAVENOUS | 0 refills | Status: DC | PRN
Start: 2018-10-27 — End: 2018-10-28

## 2018-10-27 MED ORDER — ASPIRIN 325 MG PO TAB
325 mg | Freq: Once | ORAL | 0 refills | Status: CP
Start: 2018-10-27 — End: ?
  Administered 2018-10-27: 14:00:00 325 mg via ORAL

## 2018-10-27 MED ORDER — ALUMINUM-MAGNESIUM HYDROXIDE 200-200 MG/5 ML PO SUSP
30 mL | ORAL | 0 refills | Status: DC | PRN
Start: 2018-10-27 — End: 2018-10-28

## 2018-10-27 MED ORDER — ALBUTEROL SULFATE 2.5 MG/0.5 ML IN NEBU
2.5 mg | Freq: Four times a day (QID) | RESPIRATORY_TRACT | 0 refills | Status: DC | PRN
Start: 2018-10-27 — End: 2018-10-28
  Administered 2018-10-27 – 2018-10-28 (×5): 2.5 mg via RESPIRATORY_TRACT

## 2018-10-27 MED ORDER — BUMETANIDE 2 MG PO TAB
5 mg | Freq: Two times a day (BID) | ORAL | 0 refills | Status: DC
Start: 2018-10-27 — End: 2018-10-28
  Administered 2018-10-27 – 2018-10-28 (×2): 5 mg via ORAL

## 2018-10-27 NOTE — Progress Notes
Inquired in am rounds about removing foley catheter, per Cv1 team foley catheter to stay in place for today for strict I & O.

## 2018-10-27 NOTE — Care Plan
Problem: Discharge Planning  Goal: Participation in plan of care  Outcome: Goal Ongoing  Goal: Knowledge regarding plan of care  Outcome: Goal Ongoing  Goal: Prepared for discharge  Outcome: Goal Ongoing     Problem: Infection, Risk of, Urinary Catheter-Associated Urinary Tract Infection  Goal: Absence of urinary catheter-associated infection  Outcome: Goal Ongoing     Problem: Injury-Risk of, Non-Violent Physical Restraints  Goal: Absence of Injury while physically restrained (Non-Violent)  Outcome: Goal Ongoing     Problem: Anxiety  Goal: Alleviation of anxiety  Outcome: Goal Ongoing     Problem: Respiratory Impairment (Non-Ventilated Patient)  Goal: Effective gas exchange  Outcome: Goal Ongoing  Goal: Effective breathing pattern  Outcome: Goal Ongoing  Goal: Patent airway  Outcome: Goal Ongoing     Problem: Tissue Perfusion, Altered  Goal: Adequate tissue perfusion  Outcome: Goal Ongoing     Problem: Skin Integrity  Goal: Skin integrity intact  Outcome: Goal Ongoing  Goal: Healing of skin (Pressure Injury)  Outcome: Goal Ongoing     Problem: Fluid Volume, Imbalanced  Goal: Absence of dehydration  Outcome: Goal Ongoing  Goal: Absence of fluid overload  Outcome: Goal Ongoing     Problem: Mobility/Activity Intolerance  Goal: Maximize functional ADL's and mobility outcomes  Outcome: Goal Ongoing

## 2018-10-28 ENCOUNTER — Encounter: Admit: 2018-10-22 | Discharge: 2018-10-22 | Payer: MEDICARE

## 2018-10-28 ENCOUNTER — Inpatient Hospital Stay: Admit: 2018-10-25 | Discharge: 2018-10-25 | Payer: MEDICARE

## 2018-10-28 ENCOUNTER — Encounter: Admit: 2018-10-28 | Discharge: 2018-10-28 | Payer: MEDICARE

## 2018-10-28 ENCOUNTER — Inpatient Hospital Stay: Admit: 2018-10-22 | Discharge: 2018-10-22 | Payer: MEDICARE

## 2018-10-28 ENCOUNTER — Inpatient Hospital Stay: Admit: 2018-10-24 | Discharge: 2018-10-24 | Payer: MEDICARE

## 2018-10-28 ENCOUNTER — Inpatient Hospital Stay: Admit: 2018-10-23 | Discharge: 2018-10-23 | Payer: MEDICARE

## 2018-10-28 ENCOUNTER — Inpatient Hospital Stay
Admit: 2018-10-22 | Discharge: 2018-10-28 | Disposition: A | Discharge status: Home / Self Care | Payer: MEDICARE | Source: Other Acute Inpatient Hospital | Attending: Cardiovascular Disease | Admitting: Cardiovascular Disease

## 2018-10-28 DIAGNOSIS — Z87891 Personal history of nicotine dependence: ICD-10-CM

## 2018-10-28 DIAGNOSIS — I255 Ischemic cardiomyopathy: ICD-10-CM

## 2018-10-28 DIAGNOSIS — I472 Ventricular tachycardia: ICD-10-CM

## 2018-10-28 DIAGNOSIS — Z881 Allergy status to other antibiotic agents status: ICD-10-CM

## 2018-10-28 DIAGNOSIS — I959 Hypotension, unspecified: ICD-10-CM

## 2018-10-28 DIAGNOSIS — Z8674 Personal history of sudden cardiac arrest: ICD-10-CM

## 2018-10-28 DIAGNOSIS — E872 Acidosis: ICD-10-CM

## 2018-10-28 DIAGNOSIS — I252 Old myocardial infarction: ICD-10-CM

## 2018-10-28 DIAGNOSIS — I25810 Atherosclerosis of coronary artery bypass graft(s) without angina pectoris: ICD-10-CM

## 2018-10-28 DIAGNOSIS — Z7982 Long term (current) use of aspirin: ICD-10-CM

## 2018-10-28 DIAGNOSIS — J9601 Acute respiratory failure with hypoxia: ICD-10-CM

## 2018-10-28 DIAGNOSIS — I739 Peripheral vascular disease, unspecified: ICD-10-CM

## 2018-10-28 DIAGNOSIS — I2510 Atherosclerotic heart disease of native coronary artery without angina pectoris: ICD-10-CM

## 2018-10-28 DIAGNOSIS — N179 Acute kidney failure, unspecified: ICD-10-CM

## 2018-10-28 DIAGNOSIS — J440 Chronic obstructive pulmonary disease with acute lower respiratory infection: ICD-10-CM

## 2018-10-28 DIAGNOSIS — E785 Hyperlipidemia, unspecified: ICD-10-CM

## 2018-10-28 DIAGNOSIS — Z955 Presence of coronary angioplasty implant and graft: ICD-10-CM

## 2018-10-28 DIAGNOSIS — J189 Pneumonia, unspecified organism: Principal | ICD-10-CM

## 2018-10-28 DIAGNOSIS — Z95810 Presence of automatic (implantable) cardiac defibrillator: ICD-10-CM

## 2018-10-28 DIAGNOSIS — Z885 Allergy status to narcotic agent status: ICD-10-CM

## 2018-10-28 DIAGNOSIS — Z79899 Other long term (current) drug therapy: ICD-10-CM

## 2018-10-28 DIAGNOSIS — I5023 Acute on chronic systolic (congestive) heart failure: ICD-10-CM

## 2018-10-28 DIAGNOSIS — Z9049 Acquired absence of other specified parts of digestive tract: ICD-10-CM

## 2018-10-28 DIAGNOSIS — Z888 Allergy status to other drugs, medicaments and biological substances status: ICD-10-CM

## 2018-10-28 DIAGNOSIS — I4510 Unspecified right bundle-branch block: ICD-10-CM

## 2018-10-28 LAB — BASIC METABOLIC PANEL
Lab: 134 MMOL/L — ABNORMAL LOW (ref >60)
Lab: 25 mg/dL — ABNORMAL HIGH (ref 7–25)
Lab: 39 mL/min — ABNORMAL LOW (ref >60)
Lab: 9.3 mg/dL — ABNORMAL HIGH (ref 8.5–10.6)

## 2018-10-28 LAB — CBC AND DIFF: Lab: 9.9 10*3/uL — ABNORMAL LOW (ref 4.5–11.0)

## 2018-10-28 LAB — CULTURE-BLOOD W/SENSITIVITY

## 2018-10-28 LAB — MAGNESIUM: Lab: 1.9 mg/dL — ABNORMAL LOW (ref >60)

## 2018-10-28 MED ORDER — BUMETANIDE 2 MG PO TAB
3 mg | Freq: Two times a day (BID) | ORAL | 0 refills | Status: DC
Start: 2018-10-28 — End: 2018-10-28

## 2018-10-28 MED ORDER — METOPROLOL SUCCINATE 25 MG PO TB24
25 mg | Freq: Every day | ORAL | 0 refills | Status: DC
Start: 2018-10-28 — End: 2018-10-28
  Administered 2018-10-28: 18:00:00 25 mg via ORAL

## 2018-10-28 MED ORDER — ATORVASTATIN 40 MG PO TAB
40 mg | ORAL_TABLET | Freq: Every day | ORAL | 0 refills | Status: AC
Start: 2018-10-28 — End: 2019-06-22
  Filled 2018-10-28 (×2): qty 90, 90d supply, fill #1

## 2018-10-28 MED ORDER — BUMETANIDE 1 MG PO TAB
3 mg | ORAL_TABLET | Freq: Two times a day (BID) | ORAL | 1 refills | Status: AC
Start: 2018-10-28 — End: 2019-06-22
  Filled 2018-10-28 (×2): qty 180, 30d supply, fill #1

## 2018-10-28 NOTE — Progress Notes
Heart Failure Nursing Progress Note    Admission Date: 10/22/2018  LOS: 6 days    Admission Weight: 64.2 kg (141 lb 8.6 oz)        Most recent weights (inpatient):   Vitals:    10/26/18 0400 10/27/18 0600 10/28/18 0400   Weight: 67 kg (147 lb 9.6 oz) 67 kg (147 lb 9.6 oz) 65 kg (143 lb 4.8 oz)     Weight change from previous day: -2kg    Fluid restriction ordered: No    Intake/Output Summary: (Last 24 hours)    Intake/Output Summary (Last 24 hours) at 10/28/2018 0802  Last data filed at 10/28/2018 0700  Gross per 24 hour   Intake 640 ml   Output 1590 ml   Net -950 ml       Is patient incontinent No    Anticipated discharge date: TBD  Discharge goals: Leave at dry weight       Daily Assessment of Patient Stated Goals:    Short Term Goal Identified by patient (Short Term=during hospitalization):  Get more sleep

## 2018-10-28 NOTE — Progress Notes
Brittany Mccormick discharged on 10/28/2018.  Discharge instructions reviewed with patient and family.  Valuables returned: yes  Home medications: n/a  Functional assessment at discharge complete: Yes .    Discharge orders acknowledged.  Discharge delayed while waiting for pt's daughter/caregiver to arrive to bedside from [facility] for education/transportation.  Pt assisted with dressing and packing up personal belongings.  IVs and telemetry discontinued.  Pharmacy provided teaching at bedside.  Medications delivered to bedside.  Discharge instructions reviewed with pt and pt's daughter, questions/concerns addressed.  Discharge delayed further while waiting for transportation to transport pt to the lobby via wheelchair.

## 2018-10-28 NOTE — Progress Notes
RT Exercise Oximetry Note    NAME:Brittany Mccormick                                                           MRN: [id number]                 DOB:1956-09-05            AGE: 63 y.o.  ADMISSION DATE: 10/22/2018             DAYS ADMITTED: LOS: 6 days      Date performed: 10/28/2018  Time performed: 0950  Time walked: 15 minutes    At Rest While Awake Results    Room air at rest while awake:  SpO2 = 95%    Oxygen dose required at rest while awake: no oxygen required at rest      With Exercise Results:    Room air SpO2 with exercise, if needed: Spo2 = 90%    Oxygen required with exercise: no oxygen required with exercise      Comments: Pt tolerated well.  NO oxygen required at rest, nor with exercise.       Therapist: Beatrix Shipper, RT

## 2018-10-31 ENCOUNTER — Encounter: Admit: 2018-10-31 | Discharge: 2018-10-31 | Payer: MEDICARE

## 2018-11-02 ENCOUNTER — Encounter: Admit: 2018-11-02 | Discharge: 2018-11-02 | Payer: MEDICARE

## 2018-11-05 LAB — BASIC METABOLIC PANEL
Lab: 0.8
Lab: 103
Lab: 13
Lab: 141
Lab: 25
Lab: 4.8
Lab: 74
Lab: 89
Lab: 9.8

## 2018-11-10 ENCOUNTER — Ambulatory Visit: Admit: 2018-11-10 | Discharge: 2018-11-11 | Payer: MEDICARE

## 2018-11-10 ENCOUNTER — Encounter: Admit: 2018-11-10 | Discharge: 2018-11-10 | Payer: MEDICARE

## 2018-11-10 DIAGNOSIS — E785 Hyperlipidemia, unspecified: ICD-10-CM

## 2018-11-10 DIAGNOSIS — Z9981 Dependence on supplemental oxygen: ICD-10-CM

## 2018-11-10 DIAGNOSIS — Z95810 Presence of automatic (implantable) cardiac defibrillator: ICD-10-CM

## 2018-11-10 DIAGNOSIS — I739 Peripheral vascular disease, unspecified: ICD-10-CM

## 2018-11-10 DIAGNOSIS — Z8679 Personal history of other diseases of the circulatory system: ICD-10-CM

## 2018-11-10 DIAGNOSIS — I255 Ischemic cardiomyopathy: ICD-10-CM

## 2018-11-10 DIAGNOSIS — J95821 Acute postprocedural respiratory failure: ICD-10-CM

## 2018-11-10 DIAGNOSIS — E782 Mixed hyperlipidemia: ICD-10-CM

## 2018-11-10 DIAGNOSIS — I219 Acute myocardial infarction, unspecified: ICD-10-CM

## 2018-11-10 DIAGNOSIS — M109 Gout, unspecified: ICD-10-CM

## 2018-11-10 DIAGNOSIS — Z87891 Personal history of nicotine dependence: ICD-10-CM

## 2018-11-10 DIAGNOSIS — I724 Aneurysm of artery of lower extremity: ICD-10-CM

## 2018-11-10 DIAGNOSIS — I469 Cardiac arrest, cause unspecified: ICD-10-CM

## 2018-11-10 DIAGNOSIS — M1990 Unspecified osteoarthritis, unspecified site: ICD-10-CM

## 2018-11-10 DIAGNOSIS — R200 Anesthesia of skin: ICD-10-CM

## 2018-11-10 DIAGNOSIS — I25118 Atherosclerotic heart disease of native coronary artery with other forms of angina pectoris: ICD-10-CM

## 2018-11-10 DIAGNOSIS — I2510 Atherosclerotic heart disease of native coronary artery without angina pectoris: ICD-10-CM

## 2018-11-10 MED ORDER — LISINOPRIL 10 MG PO TAB
10 mg | ORAL_TABLET | Freq: Every day | ORAL | 3 refills | Status: AC
Start: 2018-11-10 — End: 2019-06-22

## 2018-11-11 NOTE — Assessment & Plan Note
No therapies.

## 2018-11-22 ENCOUNTER — Encounter: Admit: 2018-11-22 | Discharge: 2018-11-22 | Payer: MEDICARE

## 2018-11-22 NOTE — Progress Notes
Records Request    Medical records request for continuation of care:    Patient has appointment on 12/06/2018   with  Dr. Danella Maiers* .    Please fax records to [facility]  [phone]    Request records:        Recent Labs        Thank you,      [facility]  [facility]  [address]  [place], [place] [address]  Phone:  [phone]  Fax:  [phone]

## 2018-11-25 ENCOUNTER — Encounter: Admit: 2018-11-25 | Discharge: 2018-11-25 | Payer: MEDICARE

## 2018-11-25 DIAGNOSIS — J9601 Acute respiratory failure with hypoxia: Principal | ICD-10-CM

## 2018-12-06 ENCOUNTER — Encounter: Admit: 2018-12-06 | Discharge: 2018-12-06 | Payer: MEDICARE

## 2018-12-30 ENCOUNTER — Encounter: Admit: 2018-12-30 | Discharge: 2018-12-30 | Payer: MEDICARE

## 2018-12-30 MED ORDER — CLOPIDOGREL 75 MG PO TAB
ORAL_TABLET | Freq: Every day | ORAL | 3 refills | 90.00000 days | Status: DC
Start: 2018-12-30 — End: 2019-09-02

## 2019-01-05 ENCOUNTER — Ambulatory Visit: Admit: 2019-01-05 | Discharge: 2019-01-05 | Payer: MEDICARE

## 2019-01-05 ENCOUNTER — Encounter: Admit: 2019-01-05 | Discharge: 2019-01-05 | Payer: MEDICARE

## 2019-01-05 DIAGNOSIS — I4901 Ventricular fibrillation: ICD-10-CM

## 2019-01-05 DIAGNOSIS — Z95810 Presence of automatic (implantable) cardiac defibrillator: ICD-10-CM

## 2019-01-05 DIAGNOSIS — I255 Ischemic cardiomyopathy: ICD-10-CM

## 2019-01-05 DIAGNOSIS — I5022 Chronic systolic (congestive) heart failure: ICD-10-CM

## 2019-01-05 DIAGNOSIS — Z8679 Personal history of other diseases of the circulatory system: ICD-10-CM

## 2019-01-05 DIAGNOSIS — I469 Cardiac arrest, cause unspecified: ICD-10-CM

## 2019-01-05 DIAGNOSIS — I5023 Acute on chronic systolic (congestive) heart failure: Principal | ICD-10-CM

## 2019-01-05 DIAGNOSIS — I472 Ventricular tachycardia: ICD-10-CM

## 2019-04-03 ENCOUNTER — Encounter: Admit: 2019-04-03 | Discharge: 2019-04-03

## 2019-04-06 ENCOUNTER — Ambulatory Visit: Admit: 2019-04-06 | Discharge: 2019-04-07

## 2019-04-06 ENCOUNTER — Encounter: Admit: 2019-04-06 | Discharge: 2019-04-06

## 2019-04-06 DIAGNOSIS — I219 Acute myocardial infarction, unspecified: Secondary | ICD-10-CM

## 2019-04-06 DIAGNOSIS — M1990 Unspecified osteoarthritis, unspecified site: Secondary | ICD-10-CM

## 2019-04-06 DIAGNOSIS — E785 Hyperlipidemia, unspecified: Principal | ICD-10-CM

## 2019-04-06 DIAGNOSIS — I739 Peripheral vascular disease, unspecified: Secondary | ICD-10-CM

## 2019-04-06 DIAGNOSIS — Z95810 Presence of automatic (implantable) cardiac defibrillator: Secondary | ICD-10-CM

## 2019-04-06 DIAGNOSIS — E782 Mixed hyperlipidemia: Secondary | ICD-10-CM

## 2019-04-06 DIAGNOSIS — I2510 Atherosclerotic heart disease of native coronary artery without angina pectoris: Secondary | ICD-10-CM

## 2019-04-06 DIAGNOSIS — Z9981 Dependence on supplemental oxygen: Secondary | ICD-10-CM

## 2019-04-06 DIAGNOSIS — M109 Gout, unspecified: Secondary | ICD-10-CM

## 2019-04-06 DIAGNOSIS — I255 Ischemic cardiomyopathy: Secondary | ICD-10-CM

## 2019-04-06 DIAGNOSIS — Z87891 Personal history of nicotine dependence: Secondary | ICD-10-CM

## 2019-04-06 DIAGNOSIS — I724 Aneurysm of artery of lower extremity: Secondary | ICD-10-CM

## 2019-04-06 DIAGNOSIS — I469 Cardiac arrest, cause unspecified: Secondary | ICD-10-CM

## 2019-04-06 DIAGNOSIS — Z8679 Personal history of other diseases of the circulatory system: Secondary | ICD-10-CM

## 2019-04-06 DIAGNOSIS — J95821 Acute postprocedural respiratory failure: Secondary | ICD-10-CM

## 2019-04-06 DIAGNOSIS — R200 Anesthesia of skin: Secondary | ICD-10-CM

## 2019-04-06 MED ORDER — COLCHICINE 0.6 MG PO TAB
.6 mg | ORAL_TABLET | Freq: Every day | ORAL | 3 refills | Status: DC
Start: 2019-04-06 — End: 2019-09-02

## 2019-04-06 NOTE — Assessment & Plan Note
Lab Results   Component Value Date    CHOL 166 03/03/2019    TRIG 93 03/03/2019    HDL 56 03/03/2019    LDL 91 03/03/2019    VLDL 19 03/03/2019    NONHDLCHOL 85 02/10/2014    CHOLHDLC 3 03/03/2019      LDL close to goal on atorvastatin.

## 2019-04-06 NOTE — Progress Notes
Date of Service: 04/06/2019    Brittany Mccormick is a 63 y.o. female.       HPI     Brittany Mccormick was in the [place] clinic today and basically she is doing good.  She does not get out of the house much and spends a lot of time playing computer games.  She enjoys visiting with a neighbor couple because they have good weed.    She is not having any trouble with breathlessness or peripheral edema.  She denies any chest discomfort or palpitations.  She has had no claudication or TIA symptoms.         Vitals:    04/06/19 1410   BP: 128/78   BP Source: Arm, Left Upper   Pulse: 63   Weight: 72.6 kg (160 lb)   Height: 1.422 m (4' 8)     Body mass index is 35.87 kg/m???.     Past Medical History  Patient Active Problem List    Diagnosis Date Noted   ??? Leukocytosis 10/24/2018   ??? NSVT (nonsustained ventricular tachycardia) (HCC) 10/24/2018   ??? AKI (acute kidney injury) (HCC) 10/24/2018   ??? Acute respiratory failure with hypoxia (HCC) 10/22/2018   ??? Acute on chronic systolic heart failure (HCC) 04/07/2018   ??? COPD (chronic obstructive pulmonary disease) (HCC) 04/07/2018   ??? Anxiety 04/07/2018   ??? 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 ???  Device Type:  DDD-ICD ??? ???  Model Number: RESONATE EL ICD DR Model D433 ??? ???  Serial Number: [id number] ??? ???  Implant Date: 04/04/18 ??? ???  Investigational:  No ??? ???  Wireless:  Yes ??? ???  MRI Conditional:  Yes           ??? Ischemic cardiomyopathy 04/01/2018   ??? History of cardiac arrest with ventricular fibrillation (HCC) 04/01/2018   ??? Pulmonary edema 03/31/2018   ??? 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       12/03 - CABG x 4 ([facility]): LIMA to LAD, SVG to distal RCA, SSVG-OM1, OM2.     ??? CAD (coronary artery disease), native coronary artery 04/24/2009       5/01 - Non-Q MI-transferred to [facility]        5/01 - Cardiac cath: 40% distal LM, 80% narrowing of small DX, 50-60% proximal LAD, LCX 30-50% in OM1, 70% in OM2 distal LCX 50-60%, RCA had                   high-grade stenosis with appearance of ruptured plaque in proximal RCA        5/01 - PTCA/stent to RCA.        1/03 - PTCA/stent to LAD [facility]): 2.5x12 SciMed stent; 30% LM at ostium and 30-40%                   at distal portion of LM; 1LAD proximal 80-90%, status post PCI and stent;                   three tiny DXs without any lesions; LCX 30% at prox portion with diffuse, mild,                   luminal irregularities throughout 3 OMs; RCA 30-40% in distal portion with luminal                  irregularities, LVEF 65% with normal LV wall motion.  5/03 - PCI/brachytherapy to LAD; EF 40%, distal lt main 40% (at bifurcation                   to LAD and LCX), prox 1st Dx 20%, prox posterolateral branch 30%,                   prox LAD 90% (in-stent), mid LAD 30%, RCA stent patent with 30%                   irregularities distally        12/03 - Admit [facility] for cath d/t recurrent chest pressure; cath showed severe 3-                   vessel disease.  Surgical consult.        12/03 - CABG x 4 ([facility]): LIMA to LAD, SVG to distal RCA, SSVG-OM1, OM2.         07/19 - PCI to mid circumflex    10/27/18: Cath after small NSTEMI:  1. Severe 3-vessel coronary artery disease as described above.  2. Patent left main and proximal left circumflex stent as well as mid left circumflex stent.  Unchanged from intervention in July 2013.  3. Patent saphenous vein graft to distal right coronary artery.  4. Patent left internal mammary artery to left anterior descending artery.  5. 100% saphenous vein graft occlusion to an obtuse marginal branch (previous angiography in July 2019 revealed severe diffuse disease with poor flow and subtending a small territory).  6. This may be the culprit for the patient's small non-ST-elevation myocardial infarction, but it is not amenable to revascularization.     ??? Hyperlipidemia 04/24/2009 5/01 - FLP: Total 168, Trigs 125, HDL 43, LDL 100.         02/01/00 - Zocor started.         9/01 - Total 115, Trigs 56, HDL 45, LDL 59.  Initiated Lipitor 20mg .         10/01 - Discontinue Lipitor and recheck lab values in 3-4 months.     ??? Peripheral vascular disease (HCC) 04/24/2009     Inability to advance up right femoral artery, hence procedure done on left side.        3/03 - Distal left common iliac 50-70%, mid right SFA 50%, Lt 70-80% stenosis of                    external iliac, mild disease in SFA with an occlusion of posterior tibial;                   PTA of right common iliac using a 6.0 x 29 Genesis stent, left common iliac                   PTA with a 5.0 x 28 CIQ stent.         5/04 - Abdominal aortic angiography with bilateral ileo-femoral runoff.  PTA/stent to                   left external iliac artery (re-cannulization of total occlusion; 6.0- x 100-mm                   Smart stent device deployed and post dilated in its mid and distal portions with                   5.0-mm balloon; post  dilated proximally with a 6.0-mm balloon).           6/04 - LE venous duplex for limb swelling/numbness: Mild common femoral vein reflux.                    Otherwise unremarkable study.  No evidence for CVT.          10/07 - Attempted PTA for left iliac artery occlusion.  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 History of atrial fibrillation 04/24/2009      1/04 - Post-operative a-fib. Converted with IV amiodarone     ??? Tobacco abuse 04/24/2009         Review of Systems   Constitution: Negative.   HENT: Negative.    Eyes: Negative.    Cardiovascular: Negative.    Respiratory: Negative.    Endocrine: Negative.    Hematologic/Lymphatic: Negative.    Skin: Negative.    Musculoskeletal: Negative.    Gastrointestinal: Negative.    Genitourinary: Negative.    Neurological: Negative.    Psychiatric/Behavioral: Negative.    Allergic/Immunologic: Negative. Physical Exam    Physical Exam   General Appearance: no distress   Skin: warm, no ulcers or xanthomas   Digits and Nails: no cyanosis or clubbing   Eyes: conjunctivae and lids normal, pupils are equal and round   Teeth/Gums/Palate: dentition unremarkable, no lesions   Lips & Oral Mucosa: no pallor or cyanosis   Neck Veins: normal JVP , neck veins are not distended   Thyroid: no nodules, masses, tenderness or enlargement   Chest Inspection: chest is normal in appearance   Respiratory Effort: breathing comfortably, no respiratory distress   Auscultation/Percussion: lungs clear to auscultation, no rales or rhonchi, no wheezing   PMI: PMI not enlarged or displaced   Cardiac Rhythm: regular rhythm and normal rate   Cardiac Auscultation: S1, S2 normal, no rub, no gallop   Murmurs: no murmur   Peripheral Circulation: normal peripheral circulation   Carotid Arteries: normal carotid upstroke bilaterally, no bruits   Radial Arteries: normal symmetric radial pulses   Abdominal Aorta: no abdominal aortic bruit   Pedal Pulses: normal symmetric pedal pulses   Lower Extremity Edema: no lower extremity edema   Abdominal Exam: soft, non-tender, no masses, bowel sounds normal   Liver & Spleen: no organomegaly   Gait & Station: walks without assistance   Muscle Strength: normal muscle tone   Orientation: oriented to time, place and person   Affect & Mood: appropriate and sustained affect   Language and Memory: patient responsive and seems to comprehend information   Neurologic Exam: neurological assessment grossly intact   Other: moves all extremities      Problems Addressed Today  Encounter Diagnoses   Name Primary?   ??? Mixed hyperlipidemia        Assessment and Plan     Ischemic cardiomyopathy  Class 1 HF on current medical program.  ???  Peripheral vascular disease (HCC)  No claudication symptoms currently.  ??????  CAD (coronary artery disease), native coronary artery She did not require any additional stents after her NSTEMI last month.  ???  ICD (implantable cardioverter-defibrillator), dual, in situ  No therapies.    Hyperlipidemia  Lab Results   Component Value Date    CHOL 166 03/03/2019    TRIG 93 03/03/2019    HDL 56 03/03/2019    LDL  91 03/03/2019    VLDL 19 03/03/2019    NONHDLCHOL 85 02/10/2014    CHOLHDLC 3 03/03/2019      LDL close to goal on atorvastatin.      Current Medications (including today's revisions)  ??? acetaminophen (TYLENOL) 325 mg tablet Take 325 mg by mouth every 4 hours as needed for Pain.   ??? albuterol (VENTOLIN HFA, PROAIR HFA) 90 mcg/actuation inhaler Inhale 1-2 Puffs by mouth as Needed for Wheezing.   ??? Aspirin 81 mg Tab Take 1 tablet by mouth daily.   ??? atorvastatin (LIPITOR) 40 mg tablet Take one tablet by mouth daily.   ??? bumetanide (BUMEX) 1 mg tablet Take three tablets by mouth twice daily. (Patient taking differently: Take 6 mg by mouth daily.)   ??? clopiDOGrel (PLAVIX) 75 mg tablet TAKE 1 TABLET BY MOUTH EVERY DAY   ??? colchicine 0.6 mg tablet Take one tablet by mouth daily.   ??? docusate (COLACE) 100 mg capsule Take 100 mg by mouth twice daily.   ??? lisinopril (ZESTRIL) 10 mg tablet Take one tablet by mouth daily.   ??? LYRICA 100 mg capsule Take 100 mg by mouth three times daily.   ??? metoprolol XL (TOPROL XL) 25 mg extended release tablet Take one tablet by mouth daily.   ??? SPIRIVA RESPIMAT 2.5 mcg/actuation inhaler Inhale 2 puffs by mouth into the lungs daily.   ??? spironolactone (ALDACTONE) 25 mg tablet Take one-half tablet by mouth daily. Take with food.

## 2019-04-07 DIAGNOSIS — I472 Ventricular tachycardia: Secondary | ICD-10-CM

## 2019-04-07 DIAGNOSIS — I5022 Chronic systolic (congestive) heart failure: Secondary | ICD-10-CM

## 2019-04-11 ENCOUNTER — Encounter: Admit: 2019-04-11 | Discharge: 2019-04-11

## 2019-04-11 NOTE — Telephone Encounter
-----   Message from Vickie Epley, RN sent at 04/11/2019 12:33 PM CDT -----  Regarding: FW: 3 hr AF on remote. No OAC. This has been noted in Jan as well.    ----- Message -----  From: Lillie Fragmin, RN  Sent: 04/11/2019  12:30 PM CDT  To: Cvm Nurse Gen Card Team Red  Subject: 3 hr AF on remote. No OAC. This has been not#    see remote

## 2019-04-11 NOTE — Telephone Encounter
Attempted to call pt x2 to discuss symptoms.      Discussed with Dr. Ricard Dillon as no previous history of afib noted.  He states that he just saw her in clinic last week and she is doing well.  He said for the low burden of afib, he would not recommend anticoagulation at this time.  He states we will continue to monitor and make adjustments if necessary in the future.  No need for extra device check or office visit per Dr. Ricard Dillon.

## 2019-04-21 ENCOUNTER — Encounter: Admit: 2019-04-21 | Discharge: 2019-04-21

## 2019-05-18 ENCOUNTER — Encounter: Admit: 2019-05-18 | Discharge: 2019-05-18

## 2019-05-18 NOTE — Telephone Encounter
Patient called and stated she starting itching and she believes it is from the Bumex. Patient quit taking the Bumex 1 week ago and said the itching has subsided. Patient would like to know if there is an alternative medication she can take. Patient is allergic to Lasix and Maxzide. IB sent to [facility] to review and advise.

## 2019-06-12 ENCOUNTER — Encounter: Admit: 2019-06-12 | Discharge: 2019-06-12 | Payer: MEDICARE

## 2019-06-15 ENCOUNTER — Encounter: Admit: 2019-06-15 | Discharge: 2019-06-15 | Payer: MEDICARE

## 2019-06-15 NOTE — Telephone Encounter
I called back and LM for patient to call me back about her recent symptoms.

## 2019-06-15 NOTE — Telephone Encounter
-----   Message from Buckner Malta sent at 06/13/2019  3:06 PM CDT -----  Regarding: FW: rapid heart rate  No transmission yet, did we receive?   ----- Message -----  From: Elyse Hsu  Sent: 06/13/2019  To: Mac Ep Remote  Subject: FW: rapid heart rate                             Called and provided instruction 06/12/2019 lattitude transmission received?   ----- Message -----  From: Katina Degree, RN  Sent: 06/12/2019   3:51 PM CDT  To: Mac Ep Remote  Subject: rapid heart rate                                 Patient called with concerns of episode of tachycardia and is unsure of how to send in remote check from her device. She states there is no telephone number on her machine to call. Would you be able to help her send in a remote transmission?  Thanks for your help,  Threasa Beards

## 2019-06-16 ENCOUNTER — Encounter: Admit: 2019-06-16 | Discharge: 2019-06-16 | Payer: MEDICARE

## 2019-06-16 NOTE — Telephone Encounter
-----   Message from [facility] sent at 06/15/2019  9:16 AM CDT -----  Regarding: RE: rapid heart rate  # Stored EGMs are consistent with or suggestive of Atrial Fibrillation  # AT Burden: < 1%  # Total number of events: 6 AT/AF episodes. 06/12/19 at 01:54 there was a 3 hours 35 minute AF episode with avg V rate 106 bpm. 5 other AT/AF episodes, longest 5 seconds. Egms showed AT/AF with some 1:1 AT. Current rhythm AS-VS NSR 69 bpm.  ----- Message -----  From: Buckner Malta  Sent: 06/14/2019  To: Mac Ep Remote  Subject: FW: rapid heart rate                             No transmission yet, did we receive?   ----- Message -----  From: Elyse Hsu  Sent: 06/13/2019  To: Mac Ep Remote  Subject: FW: rapid heart rate                             Called and provided instruction 06/12/2019 lattitude transmission received?   ----- Message -----  From: Katina Degree, RN  Sent: 06/12/2019   3:51 PM CDT  To: Mac Ep Remote  Subject: rapid heart rate                                 Patient called with concerns of episode of tachycardia and is unsure of how to send in remote check from her device. She states there is no telephone number on her machine to call. Would you be able to help her send in a remote transmission?  Thanks for your help,  Threasa Beards

## 2019-06-20 NOTE — Telephone Encounter
Pt continues to have alerts.  Mild sob, mild increase in wt pt attributes to stopping smoking scheduled with JST for review.

## 2019-06-20 NOTE — Telephone Encounter
-----   Message from Lillie Fragmin, RN sent at 06/20/2019 10:35 AM CDT -----  Regarding: HL crossed @ 16 today. SDO pt.  see remote.

## 2019-06-22 ENCOUNTER — Encounter: Admit: 2019-06-22 | Discharge: 2019-06-22 | Payer: MEDICARE

## 2019-06-22 DIAGNOSIS — I255 Ischemic cardiomyopathy: Secondary | ICD-10-CM

## 2019-06-22 DIAGNOSIS — Z9981 Dependence on supplemental oxygen: Secondary | ICD-10-CM

## 2019-06-22 DIAGNOSIS — I724 Aneurysm of artery of lower extremity: Secondary | ICD-10-CM

## 2019-06-22 DIAGNOSIS — I2510 Atherosclerotic heart disease of native coronary artery without angina pectoris: Secondary | ICD-10-CM

## 2019-06-22 DIAGNOSIS — R0600 Dyspnea, unspecified: Secondary | ICD-10-CM

## 2019-06-22 DIAGNOSIS — I25118 Atherosclerotic heart disease of native coronary artery with other forms of angina pectoris: Secondary | ICD-10-CM

## 2019-06-22 DIAGNOSIS — J810 Acute pulmonary edema: Secondary | ICD-10-CM

## 2019-06-22 DIAGNOSIS — J449 Chronic obstructive pulmonary disease, unspecified: Secondary | ICD-10-CM

## 2019-06-22 DIAGNOSIS — Z87891 Personal history of nicotine dependence: Secondary | ICD-10-CM

## 2019-06-22 DIAGNOSIS — R200 Anesthesia of skin: Secondary | ICD-10-CM

## 2019-06-22 DIAGNOSIS — J9601 Acute respiratory failure with hypoxia: Secondary | ICD-10-CM

## 2019-06-22 DIAGNOSIS — M109 Gout, unspecified: Secondary | ICD-10-CM

## 2019-06-22 DIAGNOSIS — J95821 Acute postprocedural respiratory failure: Secondary | ICD-10-CM

## 2019-06-22 DIAGNOSIS — Z1159 Encounter for screening for other viral diseases: Secondary | ICD-10-CM

## 2019-06-22 DIAGNOSIS — Z8679 Personal history of other diseases of the circulatory system: Secondary | ICD-10-CM

## 2019-06-22 DIAGNOSIS — I219 Acute myocardial infarction, unspecified: Secondary | ICD-10-CM

## 2019-06-22 DIAGNOSIS — M1990 Unspecified osteoarthritis, unspecified site: Secondary | ICD-10-CM

## 2019-06-22 DIAGNOSIS — Z720 Tobacco use: Secondary | ICD-10-CM

## 2019-06-22 DIAGNOSIS — E782 Mixed hyperlipidemia: Secondary | ICD-10-CM

## 2019-06-22 DIAGNOSIS — I5041 Acute combined systolic (congestive) and diastolic (congestive) heart failure: Secondary | ICD-10-CM

## 2019-06-22 DIAGNOSIS — I469 Cardiac arrest, cause unspecified: Secondary | ICD-10-CM

## 2019-06-22 DIAGNOSIS — I739 Peripheral vascular disease, unspecified: Secondary | ICD-10-CM

## 2019-06-22 DIAGNOSIS — E785 Hyperlipidemia, unspecified: Secondary | ICD-10-CM

## 2019-06-22 LAB — IRON + BINDING CAPACITY + %SAT+ FERRITIN
Lab: 127
Lab: 141
Lab: 34
Lab: 373

## 2019-06-22 LAB — CBC
Lab: 14
Lab: 151
Lab: 31 — ABNORMAL HIGH (ref 27.0–31.0)
Lab: 33
Lab: 4.5
Lab: 42
Lab: 9.3
Lab: 93

## 2019-06-22 LAB — BASIC METABOLIC PANEL
Lab: 102
Lab: 139
Lab: 15
Lab: 24
Lab: 3.7
Lab: 9.7
Lab: 98

## 2019-06-22 LAB — BNP (B-TYPE NATRIURETIC PEPTI): Lab: 504 g/dL — ABNORMAL HIGH (ref 0–100)

## 2019-06-22 LAB — MAGNESIUM: Lab: 1.9

## 2019-06-22 LAB — TSH WITH FREE T4 REFLEX: Lab: 5.6 — ABNORMAL HIGH (ref 0.35–4.94)

## 2019-06-22 MED ORDER — ATORVASTATIN 80 MG PO TAB
80 mg | ORAL_TABLET | Freq: Every day | ORAL | 3 refills | Status: DC
Start: 2019-06-22 — End: 2019-09-02

## 2019-06-22 MED ORDER — LISINOPRIL 5 MG PO TAB
5 mg | ORAL_TABLET | Freq: Every day | ORAL | 3 refills | Status: DC
Start: 2019-06-22 — End: 2019-09-02

## 2019-06-22 MED ORDER — BUMETANIDE 1 MG PO TAB
3 mg | ORAL_TABLET | Freq: Every day | ORAL | 3 refills | Status: DC
Start: 2019-06-22 — End: 2019-06-27

## 2019-06-23 ENCOUNTER — Encounter: Admit: 2019-06-23 | Discharge: 2019-06-23 | Payer: MEDICARE

## 2019-06-23 MED ORDER — POTASSIUM CHLORIDE 20 MEQ PO TBTQ
20 meq | ORAL_TABLET | Freq: Every day | ORAL | 3 refills | 30.00000 days | Status: DC
Start: 2019-06-23 — End: 2019-06-27

## 2019-06-23 NOTE — Telephone Encounter
[place], [employer], MD  P Cvm Nurse [place]              Let's add 20 meq potassium daily;.     LMOM regarding labs and adding medication. Requested a call back to discuss addition of potassium and lab results.   Orders entered for [facility]

## 2019-06-26 NOTE — Telephone Encounter
Patient will need COVID screen prior to PFT Cardiopulmonary testing.  Pt will check with PCP to see if she will order the screen, if not will plan to schedule at [facility].  Pt lives out of town and does not want to drive to [facility] for screen.

## 2019-06-27 ENCOUNTER — Encounter: Admit: 2019-06-27 | Discharge: 2019-06-27 | Payer: MEDICARE

## 2019-06-27 MED ORDER — BUMETANIDE 1 MG PO TAB
3 mg | ORAL_TABLET | Freq: Every day | ORAL | 3 refills | Status: DC
Start: 2019-06-27 — End: 2019-08-30

## 2019-06-27 MED ORDER — POTASSIUM CHLORIDE 20 MEQ PO TBTQ
20 meq | ORAL_TABLET | Freq: Every day | ORAL | 3 refills | 30.00000 days | Status: AC
Start: 2019-06-27 — End: ?

## 2019-06-27 MED ORDER — SPIRONOLACTONE 25 MG PO TAB
25 mg | ORAL_TABLET | Freq: Every day | ORAL | 3 refills | 90.00000 days | Status: AC
Start: 2019-06-27 — End: ?

## 2019-06-27 NOTE — Telephone Encounter
Message received from device team this morning: "HeartLogic Alert this morning. Elevated HeartLogic 20. S3, S3/S1 Ratio, Thoracic Impedance, and Night Heart Rate. Steadily rising."    Contacted patient for symptom check. Patient states she did gain 4 lbs overnight, states her SOA is at baseline for her. Patient denies any other symptoms besides the weight gain. Will route to provider for review.     Current medications:  Bumex 3 mg daily  Spironolactone 12.5 mg daily  Potassium 20 meq daily  Lisinopril 5 mg daily

## 2019-06-27 NOTE — Telephone Encounter
Reviewed Heart Logic alert and symptoms with Gentry Roch, APRN: Patient to increase Bumex to 3 mg BID for three days, potassium to 20 meq BID for three days. Patient to increase maintenance dose of spironolactone to 25 mg daily, have labs drawn in one week. Patient notified of instructions, read back for confirmation, states understanding.

## 2019-06-27 NOTE — Telephone Encounter
06/27/2019 11:05 AM   Patient called today to report she is scheduled for COVID-19 testing tomorrow [facility]/19 [facility]  Wilder Glade, LPN

## 2019-06-30 LAB — COVID-19 (SARS-COV-2) PCR

## 2019-07-03 ENCOUNTER — Encounter: Admit: 2019-07-03 | Discharge: 2019-07-03 | Payer: MEDICARE

## 2019-07-03 DIAGNOSIS — Z1159 Encounter for screening for other viral diseases: Secondary | ICD-10-CM

## 2019-07-03 NOTE — Telephone Encounter
Contacted patient and confirmed name and DOB. Patient advised that COVID-19 test results are negative. Advised that patient can continue with the procedure and should follow pre-procedure instructions. Advised patient to continue with home quarantine until procedure. Advised that if they develop any concerning symptoms prior to the procedure to contact their procedure team, specialist, and/or PCP for assistance.     Brittany Mccormick, BSN, RN

## 2019-07-03 NOTE — Telephone Encounter
Pt called and reports that her testing for today was cancelled as she is unable to walk on a treadmill.  She was scheduled for cardiopulmonary exercise test.      Will route to Dr. Adah Perl for recommendations and will follow up with patient.

## 2019-07-04 ENCOUNTER — Encounter: Admit: 2019-07-04 | Discharge: 2019-07-04 | Payer: MEDICARE

## 2019-07-04 NOTE — Telephone Encounter
HEART LOGIC ALERT    Received Heart Logic Alert for patient. HL index is 6, down from 20 last week.    On 06/27/19 Gentry Roch, APRN reviewed HL alert and gave the following orders:    Increase Bumex to 3 mg BID and KCl to 71meq BID for three days, then resume bumex 3 mg daily and KCl 78meq daily.  Increase maintenance dose of spironolactone to 25 mg daily.  BMP in one week.    HL index significantly improved from last week. Patient due for BMP 10/13. Will watch for lab results.             Most Recent BMP    Basic Metabolic Profile    Lab Results   Component Value Date/Time    NA 139 06/22/2019    K 3.7 06/22/2019    CA 9.7 06/22/2019    CL 102 06/22/2019    CO2 24.0 06/22/2019    GAP 17 (H) 06/22/2019    Lab Results   Component Value Date/Time    BUN 15.0 06/22/2019    CR 0.86 06/22/2019    GLU 98 06/22/2019    GLU 91 02/15/2003 05:16 AM

## 2019-07-13 ENCOUNTER — Encounter: Admit: 2019-07-13 | Discharge: 2019-07-13 | Payer: MEDICARE

## 2019-07-13 ENCOUNTER — Ambulatory Visit: Admit: 2019-07-13 | Discharge: 2019-07-13 | Payer: MEDICARE

## 2019-07-13 DIAGNOSIS — Z95810 Presence of automatic (implantable) cardiac defibrillator: Secondary | ICD-10-CM

## 2019-07-13 DIAGNOSIS — I472 Ventricular tachycardia: Secondary | ICD-10-CM

## 2019-07-13 DIAGNOSIS — I5022 Chronic systolic (congestive) heart failure: Secondary | ICD-10-CM

## 2019-07-13 DIAGNOSIS — I255 Ischemic cardiomyopathy: Secondary | ICD-10-CM

## 2019-07-13 DIAGNOSIS — I469 Cardiac arrest, cause unspecified: Secondary | ICD-10-CM

## 2019-07-16 ENCOUNTER — Encounter: Admit: 2019-07-16 | Discharge: 2019-07-16 | Payer: MEDICARE

## 2019-07-16 DIAGNOSIS — E785 Hyperlipidemia, unspecified: Principal | ICD-10-CM

## 2019-07-16 DIAGNOSIS — J95821 Acute postprocedural respiratory failure: Secondary | ICD-10-CM

## 2019-07-16 DIAGNOSIS — M1990 Unspecified osteoarthritis, unspecified site: Secondary | ICD-10-CM

## 2019-07-16 DIAGNOSIS — I739 Peripheral vascular disease, unspecified: Secondary | ICD-10-CM

## 2019-07-16 DIAGNOSIS — I469 Cardiac arrest, cause unspecified: Secondary | ICD-10-CM

## 2019-07-16 DIAGNOSIS — I255 Ischemic cardiomyopathy: Secondary | ICD-10-CM

## 2019-07-16 DIAGNOSIS — I2510 Atherosclerotic heart disease of native coronary artery without angina pectoris: Secondary | ICD-10-CM

## 2019-07-16 DIAGNOSIS — Z87891 Personal history of nicotine dependence: Secondary | ICD-10-CM

## 2019-07-16 DIAGNOSIS — Z8679 Personal history of other diseases of the circulatory system: Secondary | ICD-10-CM

## 2019-07-16 DIAGNOSIS — I724 Aneurysm of artery of lower extremity: Secondary | ICD-10-CM

## 2019-07-16 DIAGNOSIS — M109 Gout, unspecified: Secondary | ICD-10-CM

## 2019-07-16 DIAGNOSIS — I219 Acute myocardial infarction, unspecified: Secondary | ICD-10-CM

## 2019-07-16 DIAGNOSIS — R200 Anesthesia of skin: Secondary | ICD-10-CM

## 2019-07-16 DIAGNOSIS — Z9981 Dependence on supplemental oxygen: Secondary | ICD-10-CM

## 2019-07-17 ENCOUNTER — Encounter: Admit: 2019-07-17 | Discharge: 2019-07-17 | Payer: MEDICARE

## 2019-07-17 NOTE — Progress Notes
Attempted to contact patient by phone on 07/17/2019 to schedule patient for annual Device ICD Check interrogation in [facility]. No answer on patient's phone. Left message on patient's voicemail to return call to the clinic to schedule appointment.

## 2019-08-11 ENCOUNTER — Encounter: Admit: 2019-08-11 | Discharge: 2019-08-11 | Payer: MEDICARE

## 2019-08-15 ENCOUNTER — Encounter: Admit: 2019-08-15 | Discharge: 2019-08-15 | Payer: MEDICARE

## 2019-08-22 ENCOUNTER — Encounter: Admit: 2019-08-22 | Discharge: 2019-08-22 | Payer: MEDICARE

## 2019-08-27 ENCOUNTER — Encounter: Admit: 2019-08-27 | Discharge: 2019-08-27 | Payer: MEDICARE

## 2019-08-27 DIAGNOSIS — K831 Obstruction of bile duct: Secondary | ICD-10-CM

## 2019-08-27 MED ORDER — ALBUTEROL SULFATE 90 MCG/ACTUATION IN HFAA
1-2 | RESPIRATORY_TRACT | 0 refills | Status: DC | PRN
Start: 2019-08-27 — End: 2019-09-03

## 2019-08-27 MED ORDER — POTASSIUM CHLORIDE IN WATER 10 MEQ/50 ML IV PGBK
10 meq | INTRAVENOUS | 0 refills | Status: CP
Start: 2019-08-27 — End: ?
  Administered 2019-08-28: 05:00:00 10 meq via INTRAVENOUS

## 2019-08-27 MED ORDER — POTASSIUM CHLORIDE IN WATER 10 MEQ/50 ML IV PGBK
10 meq | INTRAVENOUS | 0 refills | Status: DC
Start: 2019-08-27 — End: 2019-08-28

## 2019-08-27 MED ORDER — MELATONIN 3 MG PO TAB
3 mg | Freq: Every evening | ORAL | 0 refills | Status: DC | PRN
Start: 2019-08-27 — End: 2019-09-03
  Administered 2019-08-30 – 2019-09-01 (×3): 3 mg via ORAL

## 2019-08-27 MED ORDER — DIPHENHYDRAMINE HCL 50 MG/ML IJ SOLN
12.5-25 mg | INTRAVENOUS | 0 refills | Status: DC | PRN
Start: 2019-08-27 — End: 2019-09-03

## 2019-08-27 MED ORDER — DOCUSATE SODIUM 100 MG PO CAP
100 mg | Freq: Every day | ORAL | 0 refills | Status: DC | PRN
Start: 2019-08-27 — End: 2019-09-03
  Administered 2019-09-02 (×2): 100 mg via ORAL

## 2019-08-27 MED ORDER — MAGNESIUM SULFATE IN D5W 1 GRAM/100 ML IV PGBK
1 g | INTRAVENOUS | 0 refills | Status: CP
Start: 2019-08-27 — End: ?
  Administered 2019-08-28: 06:00:00 1 g via INTRAVENOUS

## 2019-08-27 MED ORDER — ONDANSETRON HCL 4 MG PO TAB
4 mg | ORAL | 0 refills | Status: DC | PRN
Start: 2019-08-27 — End: 2019-09-03
  Administered 2019-09-02: 02:00:00 4 mg via ORAL

## 2019-08-27 MED ORDER — HEPARIN, PORCINE (PF) 5,000 UNIT/0.5 ML IJ SYRG
5000 [IU] | SUBCUTANEOUS | 0 refills | Status: DC
Start: 2019-08-27 — End: 2019-08-30
  Administered 2019-08-29 – 2019-08-30 (×4): 5000 [IU] via SUBCUTANEOUS

## 2019-08-27 MED ORDER — ONDANSETRON HCL (PF) 4 MG/2 ML IJ SOLN
4 mg | INTRAVENOUS | 0 refills | Status: DC | PRN
Start: 2019-08-27 — End: 2019-09-03
  Administered 2019-08-28 – 2019-08-31 (×4): 4 mg via INTRAVENOUS

## 2019-08-27 MED ORDER — HYDROMORPHONE (PF) 2 MG/ML IJ SYRG
.5-1 mg | INTRAVENOUS | 0 refills | Status: DC | PRN
Start: 2019-08-27 — End: 2019-08-30
  Administered 2019-08-29: 22:00:00 0.5 mg via INTRAVENOUS

## 2019-08-27 MED ORDER — POTASSIUM CHLORIDE IN WATER 10 MEQ/50 ML IV PGBK
10 meq | INTRAVENOUS | 0 refills | Status: CP
Start: 2019-08-27 — End: ?
  Administered 2019-08-28: 06:00:00 10 meq via INTRAVENOUS

## 2019-08-27 MED ORDER — POTASSIUM CHLORIDE IN WATER 10 MEQ/50 ML IV PGBK
10 meq | INTRAVENOUS | 0 refills | Status: DC
Start: 2019-08-27 — End: 2019-08-28
  Administered 2019-08-28: 14:00:00 10 meq via INTRAVENOUS

## 2019-08-27 MED ORDER — POTASSIUM CHLORIDE IN WATER 10 MEQ/50 ML IV PGBK
10 meq | INTRAVENOUS | 0 refills | Status: AC
Start: 2019-08-27 — End: ?

## 2019-08-27 MED ORDER — POTASSIUM CHLORIDE IN WATER 10 MEQ/50 ML IV PGBK
10 meq | INTRAVENOUS | 0 refills | Status: CP
Start: 2019-08-27 — End: ?
  Administered 2019-08-28: 09:00:00 10 meq via INTRAVENOUS

## 2019-08-27 MED ORDER — TIOTROPIUM BROMIDE 18 MCG IN CPDV
1 | Freq: Every day | RESPIRATORY_TRACT | 0 refills | Status: DC
Start: 2019-08-27 — End: 2019-09-03
  Administered 2019-08-28 – 2019-09-02 (×2): 1 via RESPIRATORY_TRACT

## 2019-08-27 MED ORDER — SODIUM CHLORIDE 0.45 % IV SOLP
INTRAVENOUS | 0 refills | Status: DC
Start: 2019-08-27 — End: 2019-08-29
  Administered 2019-08-28 – 2019-08-29 (×2): 1000.000 mL via INTRAVENOUS

## 2019-08-27 MED ORDER — ACETAMINOPHEN 325 MG PO TAB
325 mg | ORAL | 0 refills | Status: DC | PRN
Start: 2019-08-27 — End: 2019-08-30
  Administered 2019-08-28 – 2019-08-30 (×5): 325 mg via ORAL

## 2019-08-27 MED ORDER — ASPIRIN 325 MG PO TAB
325 mg | Freq: Once | ORAL | 0 refills | Status: CP
Start: 2019-08-27 — End: ?
  Administered 2019-08-28: 06:00:00 325 mg via ORAL

## 2019-08-27 MED ORDER — ASPIRIN 81 MG PO TBEC
81 mg | Freq: Every day | ORAL | 0 refills | Status: DC
Start: 2019-08-27 — End: 2019-08-29
  Administered 2019-08-28 – 2019-08-29 (×2): 81 mg via ORAL

## 2019-08-27 MED ORDER — METOPROLOL SUCCINATE 25 MG PO TB24
25 mg | Freq: Every day | ORAL | 0 refills | Status: DC
Start: 2019-08-27 — End: 2019-09-03
  Administered 2019-08-28 – 2019-09-02 (×5): 25 mg via ORAL

## 2019-08-27 NOTE — Progress Notes
Patient wellknown to [facility] is in ED with abdominal pain. Her liver enzymes are unusually elevated and [place] of abdomen reveals dilated common bile duct due to obstructive pathology. Patient will require MRCP and/or ERCP. Patient has no s/s of COVID-19; however, I have requested rapid test to assure she is COVID-19 negative.

## 2019-08-28 ENCOUNTER — Inpatient Hospital Stay: Admit: 2019-08-28 | Payer: MEDICARE

## 2019-08-28 ENCOUNTER — Inpatient Hospital Stay: Admit: 2019-08-28 | Discharge: 2019-08-28 | Payer: MEDICARE

## 2019-08-28 ENCOUNTER — Encounter: Admit: 2019-08-28 | Discharge: 2019-08-28 | Payer: MEDICARE

## 2019-08-28 DIAGNOSIS — J95821 Acute postprocedural respiratory failure: Secondary | ICD-10-CM

## 2019-08-28 DIAGNOSIS — I469 Cardiac arrest, cause unspecified: Secondary | ICD-10-CM

## 2019-08-28 DIAGNOSIS — I255 Ischemic cardiomyopathy: Secondary | ICD-10-CM

## 2019-08-28 DIAGNOSIS — I219 Acute myocardial infarction, unspecified: Secondary | ICD-10-CM

## 2019-08-28 DIAGNOSIS — E785 Hyperlipidemia, unspecified: Secondary | ICD-10-CM

## 2019-08-28 DIAGNOSIS — Z87891 Personal history of nicotine dependence: Secondary | ICD-10-CM

## 2019-08-28 DIAGNOSIS — M1990 Unspecified osteoarthritis, unspecified site: Secondary | ICD-10-CM

## 2019-08-28 DIAGNOSIS — M109 Gout, unspecified: Secondary | ICD-10-CM

## 2019-08-28 DIAGNOSIS — I2510 Atherosclerotic heart disease of native coronary artery without angina pectoris: Secondary | ICD-10-CM

## 2019-08-28 DIAGNOSIS — Z8679 Personal history of other diseases of the circulatory system: Secondary | ICD-10-CM

## 2019-08-28 DIAGNOSIS — R200 Anesthesia of skin: Secondary | ICD-10-CM

## 2019-08-28 DIAGNOSIS — I724 Aneurysm of artery of lower extremity: Secondary | ICD-10-CM

## 2019-08-28 DIAGNOSIS — I739 Peripheral vascular disease, unspecified: Secondary | ICD-10-CM

## 2019-08-28 DIAGNOSIS — Z9981 Dependence on supplemental oxygen: Secondary | ICD-10-CM

## 2019-08-28 LAB — CBC AND DIFF
Lab: 0 10*3/uL (ref 0–0.20)
Lab: 0 10*3/uL (ref 0–0.45)
Lab: 0.5 K/UL — ABNORMAL LOW (ref >60)
Lab: 114 K/UL — ABNORMAL LOW (ref 150–400)
Lab: 12 FL — ABNORMAL HIGH (ref 7–11)
Lab: 2.4 K/UL — ABNORMAL HIGH (ref 1.8–7.0)
Lab: 33 g/dL — ABNORMAL HIGH (ref 32.0–36.0)
Lab: 4 10*3/uL — ABNORMAL LOW (ref 4.5–11.0)

## 2019-08-28 LAB — PHOSPHORUS: Lab: 4.1 mg/dL — ABNORMAL HIGH (ref 2.0–4.5)

## 2019-08-28 LAB — COMPREHENSIVE METABOLIC PANEL
Lab: 139 MMOL/L — ABNORMAL LOW (ref 137–147)
Lab: 25 MMOL/L (ref 21–30)
Lab: 284 U/L — ABNORMAL HIGH (ref 25–110)
Lab: 3.6 g/dL (ref 3.5–5.0)
Lab: 310 U/L — ABNORMAL HIGH (ref 7–56)
Lab: 37 mL/min — ABNORMAL LOW (ref >60)
Lab: 511 U/L — ABNORMAL HIGH (ref 7–40)
Lab: 7.2 mg/dL — ABNORMAL LOW (ref 8.5–10.6)
Lab: 96 MMOL/L — ABNORMAL LOW (ref 98–110)

## 2019-08-28 LAB — MAGNESIUM: Lab: 1.2 mg/dL — ABNORMAL LOW (ref 1.6–2.6)

## 2019-08-28 LAB — LACTIC ACID(LACTATE): Lab: 1.3 MMOL/L (ref 0.5–2.0)

## 2019-08-28 LAB — TROPONIN-I: Lab: 0.1 ng/mL — ABNORMAL HIGH (ref 0.0–0.05)

## 2019-08-28 LAB — BILIRUBIN, DIRECT: Lab: 3 mg/dL — ABNORMAL HIGH (ref <0.4)

## 2019-08-28 LAB — PROTIME INR (PT): Lab: 2.7 MMOL/L — ABNORMAL HIGH (ref 0.8–1.2)

## 2019-08-28 MED ORDER — ONDANSETRON HCL (PF) 4 MG/2 ML IJ SOLN
INTRAVENOUS | 0 refills | Status: DC
Start: 2019-08-28 — End: 2019-08-28
  Administered 2019-08-28: 22:00:00 4 mg via INTRAVENOUS

## 2019-08-28 MED ORDER — POTASSIUM CHLORIDE IN WATER 10 MEQ/50 ML IV PGBK
10 meq | INTRAVENOUS | 0 refills | Status: DC
Start: 2019-08-28 — End: 2019-08-28

## 2019-08-28 MED ORDER — POTASSIUM CHLORIDE 20 MEQ PO TBTQ
40 meq | Freq: Once | ORAL | 0 refills | Status: CP
Start: 2019-08-28 — End: ?
  Administered 2019-08-29: 05:00:00 40 meq via ORAL

## 2019-08-28 MED ORDER — CEFTRIAXONE INJ 2GM IVP
2 g | INTRAVENOUS | 0 refills | Status: DC
Start: 2019-08-28 — End: 2019-09-03
  Administered 2019-08-28 – 2019-09-02 (×6): 2 g via INTRAVENOUS

## 2019-08-28 MED ORDER — POTASSIUM CHLORIDE IN WATER 10 MEQ/50 ML IV PGBK
10 meq | INTRAVENOUS | 0 refills | Status: CP
Start: 2019-08-28 — End: ?
  Administered 2019-08-28: 19:00:00 10 meq via INTRAVENOUS

## 2019-08-28 MED ORDER — PROMETHAZINE 25 MG/ML IJ SOLN
6.25 mg | INTRAVENOUS | 0 refills | Status: CN | PRN
Start: 2019-08-28 — End: ?

## 2019-08-28 MED ORDER — METRONIDAZOLE 500 MG PO TAB
500 mg | Freq: Three times a day (TID) | ORAL | 0 refills | Status: DC
Start: 2019-08-28 — End: 2019-09-03
  Administered 2019-08-28 – 2019-09-02 (×14): 500 mg via ORAL

## 2019-08-28 MED ORDER — POTASSIUM CHLORIDE 20 MEQ PO TBTQ
40 meq | Freq: Once | ORAL | 0 refills | Status: CP
Start: 2019-08-28 — End: ?
  Administered 2019-08-28: 19:00:00 40 meq via ORAL

## 2019-08-28 MED ORDER — MAGNESIUM SULFATE IN D5W 1 GRAM/100 ML IV PGBK
1 g | INTRAVENOUS | 0 refills | Status: CP
Start: 2019-08-28 — End: ?
  Administered 2019-08-29: 05:00:00 1 g via INTRAVENOUS

## 2019-08-28 MED ORDER — FENTANYL CITRATE (PF) 50 MCG/ML IJ SOLN
50 ug | INTRAVENOUS | 0 refills | Status: CN | PRN
Start: 2019-08-28 — End: ?

## 2019-08-28 MED ORDER — FENTANYL CITRATE (PF) 50 MCG/ML IJ SOLN
25 ug | INTRAVENOUS | 0 refills | Status: CN | PRN
Start: 2019-08-28 — End: ?

## 2019-08-28 MED ORDER — POTASSIUM CHLORIDE 20 MEQ PO TBTQ
20 meq | ORAL | 0 refills | Status: AC
Start: 2019-08-28 — End: ?
  Administered 2019-08-28 (×2): 20 meq via ORAL

## 2019-08-28 MED ORDER — POTASSIUM CHLORIDE 20 MEQ PO TBTQ
40 meq | Freq: Once | ORAL | 0 refills | Status: CP
Start: 2019-08-28 — End: ?
  Administered 2019-08-29: 10:00:00 40 meq via ORAL

## 2019-08-28 MED ORDER — SUCCINYLCHOLINE CHLORIDE 20 MG/ML IJ SOLN
INTRAVENOUS | 0 refills | Status: DC
Start: 2019-08-28 — End: 2019-08-28
  Administered 2019-08-28: 22:00:00 100 mg via INTRAVENOUS

## 2019-08-28 MED ORDER — LACTATED RINGERS IV SOLP
1000 mL | INTRAVENOUS | 0 refills | Status: DC
Start: 2019-08-28 — End: 2019-08-29
  Administered 2019-08-28: 21:00:00 1000 mL via INTRAVENOUS

## 2019-08-28 MED ORDER — CLOPIDOGREL 75 MG PO TAB
75 mg | Freq: Every day | ORAL | 0 refills | Status: DC
Start: 2019-08-28 — End: 2019-08-28

## 2019-08-28 MED ORDER — PHENYLEPHRINE IN 0.9% NACL(PF) 1 MG/10 ML (100 MCG/ML) IV SYRG
INTRAVENOUS | 0 refills | Status: DC
Start: 2019-08-28 — End: 2019-08-28
  Administered 2019-08-28 (×2): 100 ug via INTRAVENOUS

## 2019-08-28 MED ORDER — MAGNESIUM SULFATE 4 MEQ/ML (50 %) IJ SOLN
1 g | Freq: Once | INTRAVENOUS | 0 refills | Status: DC
Start: 2019-08-28 — End: 2019-08-28

## 2019-08-28 MED ORDER — CALCIUM CARBONATE 200 MG CALCIUM (500 MG) PO CHEW
500 mg | Freq: Once | ORAL | 0 refills | Status: CP
Start: 2019-08-28 — End: ?
  Administered 2019-08-28: 14:00:00 500 mg via ORAL

## 2019-08-28 MED ORDER — PROPOFOL INJ 10 MG/ML IV VIAL
0 refills | Status: DC
Start: 2019-08-28 — End: 2019-08-28
  Administered 2019-08-28: 22:00:00 100 mg via INTRAVENOUS

## 2019-08-28 MED ORDER — METOCLOPRAMIDE HCL 5 MG/ML IJ SOLN
10 mg | Freq: Once | INTRAVENOUS | 0 refills | Status: CN | PRN
Start: 2019-08-28 — End: ?

## 2019-08-28 MED ORDER — LIDOCAINE (PF) 200 MG/10 ML (2 %) IJ SYRG
0 refills | Status: DC
Start: 2019-08-28 — End: 2019-08-28
  Administered 2019-08-28: 22:00:00 50 mg via INTRAVENOUS

## 2019-08-28 MED ORDER — PHYTONADIONE (VITAMIN K1) 5 MG PO TAB
5 mg | Freq: Every day | ORAL | 0 refills | Status: DC
Start: 2019-08-28 — End: 2019-08-29
  Administered 2019-08-29 (×2): 5 mg via ORAL

## 2019-08-28 MED ORDER — PERFLUTREN LIPID MICROSPHERES 1.1 MG/ML IV SUSP
1-20 mL | Freq: Once | INTRAVENOUS | 0 refills | Status: CP | PRN
Start: 2019-08-28 — End: ?
  Administered 2019-08-28: 18:00:00 2 mL via INTRAVENOUS

## 2019-08-28 MED ORDER — FENTANYL CITRATE (PF) 50 MCG/ML IJ SOLN
0 refills | Status: DC
Start: 2019-08-28 — End: 2019-08-28
  Administered 2019-08-28: 22:00:00 100 ug via INTRAVENOUS

## 2019-08-28 MED ORDER — MAGNESIUM SULFATE IN D5W 1 GRAM/100 ML IV PGBK
1 g | INTRAVENOUS | 0 refills | Status: CP
Start: 2019-08-28 — End: ?
  Administered 2019-08-28: 14:00:00 1 g via INTRAVENOUS

## 2019-08-28 MED ORDER — POTASSIUM CHLORIDE IN WATER 10 MEQ/50 ML IV PGBK
10 meq | INTRAVENOUS | 0 refills | Status: AC
Start: 2019-08-28 — End: ?

## 2019-08-28 NOTE — Anesthesia Post-Procedure Evaluation
Post-Anesthesia Evaluation    Name: Brittany Mccormick      MRN: [id number]     DOB: March 13, 1956     Age: 63 y.o.     Sex: female   __________________________________________________________________________     Procedure Information     Anesthesia Start Date/Time: 08/28/19 1525    Procedure: ESOPHAGOGASTRODUODENOSCOPY WITH ENDOSCOPIC ULTRASOUND EXAMINATION - FLEXIBLE (N/A )    Location: ENDO 2 / ENDO/GI    Surgeon: Gordy Savers, MD          Post-Anesthesia Vitals  BP: 133/80 (12/07 1554)  Temp: (P) 36.2 C (97.2 F) (12/07 1554)  Pulse: (P) 80 (12/07 1554)  Respirations: 10 PER MINUTE (12/07 1519)  SpO2: 100 % (12/07 1519)  Height: 142.2 cm (56") (12/07 1519)   Vitals Value Taken Time   BP     Temp     Pulse 78 08/28/19 1603   Respirations 15 PER MINUTE 08/28/19 1603   SpO2 99 % 08/28/19 1603   Vitals shown include unvalidated device data.      Post Anesthesia Evaluation Note    Evaluation location: other  Patient participation: recovered; patient participated in evaluation  Level of consciousness: alert    Pain score: 0  Pain management: adequate    Hydration: normovolemia  Temperature: 36.0C - 38.4C  Airway patency: adequate    Perioperative Events      Postoperative Status  Cardiovascular status: hemodynamically stable  Respiratory status: spontaneous ventilation        Perioperative Events  Perioperative Event: No  Emergency Case Activation: No

## 2019-08-28 NOTE — Progress Notes
Patient arrived to room # [phone]*) via cart accompanied by EMS. Patient transferred to the bed with assistance. Bedside safety checks completed. Initial patient assessment completed. Refer to flowsheet for details.    Admission skin assessment completed with: MARGARET, RN    Pressure injury present on arrival?: No    1. Head/Face/Neck: No  2. Trunk/Back: No  3. Upper Extremities: No  4. Lower Extremities: No  5. Pelvic/Coccyx: No  6. Assessed for device associated injury? Yes  7. Malnutrition Screening Tool (Nursing Nutrition Assessment) Completed? Yes    See Doc Flowsheet for additional wound details.     INTERVENTIONS:

## 2019-08-29 ENCOUNTER — Encounter: Admit: 2019-08-29 | Discharge: 2019-08-29 | Payer: MEDICARE

## 2019-08-29 DIAGNOSIS — I255 Ischemic cardiomyopathy: Secondary | ICD-10-CM

## 2019-08-29 DIAGNOSIS — I469 Cardiac arrest, cause unspecified: Secondary | ICD-10-CM

## 2019-08-29 DIAGNOSIS — I2510 Atherosclerotic heart disease of native coronary artery without angina pectoris: Secondary | ICD-10-CM

## 2019-08-29 DIAGNOSIS — Z87891 Personal history of nicotine dependence: Secondary | ICD-10-CM

## 2019-08-29 DIAGNOSIS — R200 Anesthesia of skin: Secondary | ICD-10-CM

## 2019-08-29 DIAGNOSIS — Z9981 Dependence on supplemental oxygen: Secondary | ICD-10-CM

## 2019-08-29 DIAGNOSIS — M109 Gout, unspecified: Secondary | ICD-10-CM

## 2019-08-29 DIAGNOSIS — I219 Acute myocardial infarction, unspecified: Secondary | ICD-10-CM

## 2019-08-29 DIAGNOSIS — M1990 Unspecified osteoarthritis, unspecified site: Secondary | ICD-10-CM

## 2019-08-29 DIAGNOSIS — E785 Hyperlipidemia, unspecified: Secondary | ICD-10-CM

## 2019-08-29 DIAGNOSIS — Z8679 Personal history of other diseases of the circulatory system: Secondary | ICD-10-CM

## 2019-08-29 DIAGNOSIS — J95821 Acute postprocedural respiratory failure: Secondary | ICD-10-CM

## 2019-08-29 DIAGNOSIS — I739 Peripheral vascular disease, unspecified: Secondary | ICD-10-CM

## 2019-08-29 DIAGNOSIS — I724 Aneurysm of artery of lower extremity: Secondary | ICD-10-CM

## 2019-08-29 MED ORDER — PREGABALIN 75 MG PO CAP
75 mg | Freq: Two times a day (BID) | ORAL | 0 refills | Status: DC
Start: 2019-08-29 — End: 2019-09-03
  Administered 2019-08-29 – 2019-09-02 (×9): 75 mg via ORAL

## 2019-08-29 MED ORDER — PREGABALIN 50 MG PO CAP
100 mg | Freq: Once | ORAL | 0 refills | Status: CP
Start: 2019-08-29 — End: ?
  Administered 2019-08-29: 10:00:00 100 mg via ORAL

## 2019-08-29 MED ORDER — PHYTONADIONE (VITAMIN K1) 5 MG PO TAB
10 mg | Freq: Every day | ORAL | 0 refills | Status: CP
Start: 2019-08-29 — End: ?
  Administered 2019-08-30: 14:00:00 10 mg via ORAL

## 2019-08-30 MED ORDER — DICLOFENAC SODIUM 1 % TP GEL
2 g | Freq: Four times a day (QID) | TOPICAL | 0 refills | Status: DC
Start: 2019-08-30 — End: 2019-09-03
  Administered 2019-08-30: 19:00:00 2 g via TOPICAL

## 2019-08-30 MED ORDER — BUMETANIDE 2 MG PO TAB
2 mg | Freq: Two times a day (BID) | ORAL | 0 refills | Status: DC
Start: 2019-08-30 — End: 2019-08-31
  Administered 2019-08-31: 01:00:00 2 mg via ORAL

## 2019-08-30 MED ORDER — LIDOCAINE 5 % TP PTMD
1 | Freq: Every day | TOPICAL | 0 refills | Status: DC
Start: 2019-08-30 — End: 2019-09-03
  Administered 2019-08-30 – 2019-09-02 (×3): 1 via TOPICAL

## 2019-08-30 MED ORDER — ACETAMINOPHEN 500 MG PO TAB
500 mg | ORAL | 0 refills | Status: DC
Start: 2019-08-30 — End: 2019-09-03
  Administered 2019-08-30 – 2019-09-02 (×7): 500 mg via ORAL

## 2019-08-30 MED ORDER — FENTANYL CITRATE (PF) 50 MCG/ML IJ SOLN
25 ug | INTRAVENOUS | 0 refills | Status: DC | PRN
Start: 2019-08-30 — End: 2019-09-01
  Administered 2019-08-30 – 2019-08-31 (×5): 25 ug via INTRAVENOUS
  Administered 2019-08-31: 23:00:00 100 ug via INTRAVENOUS
  Administered 2019-08-31 – 2019-09-01 (×4): 25 ug via INTRAVENOUS

## 2019-08-30 MED ORDER — MAGNESIUM SULFATE IN D5W 1 GRAM/100 ML IV PGBK
1 g | INTRAVENOUS | 0 refills | Status: CP
Start: 2019-08-30 — End: ?
  Administered 2019-08-30: 19:00:00 1 g via INTRAVENOUS

## 2019-08-30 MED ORDER — BUMETANIDE 2 MG PO TAB
2 mg | Freq: Once | ORAL | 0 refills | Status: CP
Start: 2019-08-30 — End: ?
  Administered 2019-08-30: 14:00:00 2 mg via ORAL

## 2019-08-30 MED ORDER — POTASSIUM CHLORIDE 20 MEQ PO TBTQ
40 meq | Freq: Once | ORAL | 0 refills | Status: CP
Start: 2019-08-30 — End: ?
  Administered 2019-08-30: 18:00:00 40 meq via ORAL

## 2019-08-31 ENCOUNTER — Encounter: Admit: 2019-08-31 | Discharge: 2019-08-31 | Payer: MEDICARE

## 2019-08-31 ENCOUNTER — Inpatient Hospital Stay: Admit: 2019-08-31 | Discharge: 2019-08-31 | Payer: MEDICARE

## 2019-08-31 DIAGNOSIS — I219 Acute myocardial infarction, unspecified: Secondary | ICD-10-CM

## 2019-08-31 DIAGNOSIS — I724 Aneurysm of artery of lower extremity: Secondary | ICD-10-CM

## 2019-08-31 DIAGNOSIS — R200 Anesthesia of skin: Secondary | ICD-10-CM

## 2019-08-31 DIAGNOSIS — M1990 Unspecified osteoarthritis, unspecified site: Secondary | ICD-10-CM

## 2019-08-31 DIAGNOSIS — I469 Cardiac arrest, cause unspecified: Secondary | ICD-10-CM

## 2019-08-31 DIAGNOSIS — Z87891 Personal history of nicotine dependence: Secondary | ICD-10-CM

## 2019-08-31 DIAGNOSIS — Z9981 Dependence on supplemental oxygen: Secondary | ICD-10-CM

## 2019-08-31 DIAGNOSIS — I2510 Atherosclerotic heart disease of native coronary artery without angina pectoris: Secondary | ICD-10-CM

## 2019-08-31 DIAGNOSIS — Z8679 Personal history of other diseases of the circulatory system: Secondary | ICD-10-CM

## 2019-08-31 DIAGNOSIS — I739 Peripheral vascular disease, unspecified: Secondary | ICD-10-CM

## 2019-08-31 DIAGNOSIS — I255 Ischemic cardiomyopathy: Secondary | ICD-10-CM

## 2019-08-31 DIAGNOSIS — M109 Gout, unspecified: Secondary | ICD-10-CM

## 2019-08-31 DIAGNOSIS — J95821 Acute postprocedural respiratory failure: Secondary | ICD-10-CM

## 2019-08-31 DIAGNOSIS — E785 Hyperlipidemia, unspecified: Secondary | ICD-10-CM

## 2019-08-31 MED ORDER — ONDANSETRON HCL (PF) 4 MG/2 ML IJ SOLN
4 mg | Freq: Once | INTRAVENOUS | 0 refills | Status: CN | PRN
Start: 2019-08-31 — End: ?

## 2019-08-31 MED ORDER — IOPAMIDOL 61 % 100 ML IV SOLN (OT)(OSM)
0 refills | Status: DC
Start: 2019-08-31 — End: 2019-08-31
  Administered 2019-08-31: 50 mL via INTRAMUSCULAR

## 2019-08-31 MED ORDER — PROMETHAZINE 25 MG/ML IJ SOLN
6.25 mg | INTRAVENOUS | 0 refills | Status: CN | PRN
Start: 2019-08-31 — End: ?

## 2019-08-31 MED ORDER — PROPOFOL INJ 10 MG/ML IV VIAL
0 refills | Status: DC
Start: 2019-08-31 — End: 2019-08-31
  Administered 2019-08-31: 23:00:00 80 mg via INTRAVENOUS

## 2019-08-31 MED ORDER — PHENYLEPHRINE IN 0.9% NACL(PF) 1 MG/10 ML (100 MCG/ML) IV SYRG
INTRAVENOUS | 0 refills | Status: DC
Start: 2019-08-31 — End: 2019-08-31
  Administered 2019-08-31: 23:00:00 100 ug via INTRAVENOUS

## 2019-08-31 MED ORDER — POTASSIUM CHLORIDE 20 MEQ PO TBTQ
20 meq | ORAL | 0 refills | Status: CP
Start: 2019-08-31 — End: ?
  Administered 2019-08-31 (×3): 20 meq via ORAL

## 2019-08-31 MED ORDER — FENTANYL CITRATE (PF) 50 MCG/ML IJ SOLN
50 ug | INTRAVENOUS | 0 refills | Status: CN | PRN
Start: 2019-08-31 — End: ?

## 2019-08-31 MED ORDER — DEXTRAN 70-HYPROMELLOSE (PF) 0.1-0.3 % OP DPET
0 refills | Status: DC
Start: 2019-08-31 — End: 2019-08-31

## 2019-08-31 MED ORDER — MAGNESIUM SULFATE IN D5W 1 GRAM/100 ML IV PGBK
1 g | INTRAVENOUS | 0 refills | Status: CP
Start: 2019-08-31 — End: ?
  Administered 2019-09-01: 05:00:00 1 g via INTRAVENOUS

## 2019-08-31 MED ORDER — POTASSIUM CHLORIDE 20 MEQ PO TBTQ
40 meq | Freq: Once | ORAL | 0 refills | Status: CP
Start: 2019-08-31 — End: ?
  Administered 2019-09-01: 05:00:00 40 meq via ORAL

## 2019-08-31 MED ORDER — MAGNESIUM SULFATE IN D5W 1 GRAM/100 ML IV PGBK
1 g | Freq: Once | INTRAVENOUS | 0 refills | Status: CP
Start: 2019-08-31 — End: ?
  Administered 2019-09-01: 01:00:00 1 g via INTRAVENOUS

## 2019-08-31 MED ORDER — MAGNESIUM SULFATE IN D5W 1 GRAM/100 ML IV PGBK
1 g | INTRAVENOUS | 0 refills | Status: CP
Start: 2019-08-31 — End: ?
  Administered 2019-08-31: 15:00:00 1 g via INTRAVENOUS

## 2019-08-31 MED ORDER — POTASSIUM CHLORIDE 20 MEQ PO TBTQ
20 meq | ORAL | 0 refills | Status: AC
Start: 2019-08-31 — End: ?

## 2019-08-31 MED ORDER — LACTATED RINGERS IV SOLP
INTRAVENOUS | 0 refills | Status: AC
Start: 2019-08-31 — End: ?
  Administered 2019-08-31: 20:00:00 1000.000 mL via INTRAVENOUS

## 2019-08-31 MED ORDER — SUCCINYLCHOLINE CHLORIDE 20 MG/ML IJ SOLN
INTRAVENOUS | 0 refills | Status: DC
Start: 2019-08-31 — End: 2019-08-31
  Administered 2019-08-31: 23:00:00 120 mg via INTRAVENOUS

## 2019-09-01 ENCOUNTER — Inpatient Hospital Stay: Admit: 2019-09-01 | Discharge: 2019-09-01 | Payer: MEDICARE

## 2019-09-01 ENCOUNTER — Encounter: Admit: 2019-09-01 | Discharge: 2019-09-01 | Payer: MEDICARE

## 2019-09-01 DIAGNOSIS — J95821 Acute postprocedural respiratory failure: Secondary | ICD-10-CM

## 2019-09-01 DIAGNOSIS — R200 Anesthesia of skin: Secondary | ICD-10-CM

## 2019-09-01 DIAGNOSIS — I469 Cardiac arrest, cause unspecified: Secondary | ICD-10-CM

## 2019-09-01 DIAGNOSIS — Z87891 Personal history of nicotine dependence: Secondary | ICD-10-CM

## 2019-09-01 DIAGNOSIS — I739 Peripheral vascular disease, unspecified: Secondary | ICD-10-CM

## 2019-09-01 DIAGNOSIS — I724 Aneurysm of artery of lower extremity: Secondary | ICD-10-CM

## 2019-09-01 DIAGNOSIS — M1990 Unspecified osteoarthritis, unspecified site: Secondary | ICD-10-CM

## 2019-09-01 DIAGNOSIS — I255 Ischemic cardiomyopathy: Secondary | ICD-10-CM

## 2019-09-01 DIAGNOSIS — Z9981 Dependence on supplemental oxygen: Secondary | ICD-10-CM

## 2019-09-01 DIAGNOSIS — Z8679 Personal history of other diseases of the circulatory system: Secondary | ICD-10-CM

## 2019-09-01 DIAGNOSIS — E785 Hyperlipidemia, unspecified: Secondary | ICD-10-CM

## 2019-09-01 DIAGNOSIS — M109 Gout, unspecified: Secondary | ICD-10-CM

## 2019-09-01 DIAGNOSIS — I2510 Atherosclerotic heart disease of native coronary artery without angina pectoris: Secondary | ICD-10-CM

## 2019-09-01 DIAGNOSIS — I219 Acute myocardial infarction, unspecified: Secondary | ICD-10-CM

## 2019-09-01 MED ORDER — SPIRONOLACTONE 25 MG PO TAB
25 mg | Freq: Every day | ORAL | 0 refills | Status: DC
Start: 2019-09-01 — End: 2019-09-03
  Administered 2019-09-01 – 2019-09-02 (×2): 25 mg via ORAL

## 2019-09-01 MED ORDER — PROCHLORPERAZINE EDISYLATE 5 MG/ML IJ SOLN
10 mg | Freq: Once | INTRAVENOUS | 0 refills | Status: CP
Start: 2019-09-01 — End: ?
  Administered 2019-09-02: 04:00:00 10 mg via INTRAVENOUS

## 2019-09-01 MED ORDER — ASPIRIN 81 MG PO CHEW
81 mg | Freq: Every day | ORAL | 0 refills | Status: DC
Start: 2019-09-01 — End: 2019-09-03
  Administered 2019-09-02 (×2): 81 mg via ORAL

## 2019-09-01 MED ORDER — BUMETANIDE 2 MG PO TAB
2 mg | Freq: Every day | ORAL | 0 refills | Status: DC
Start: 2019-09-01 — End: 2019-09-03
  Administered 2019-09-01 – 2019-09-02 (×2): 2 mg via ORAL

## 2019-09-01 MED ORDER — POTASSIUM CHLORIDE 20 MEQ PO TBTQ
40 meq | Freq: Once | ORAL | 0 refills | Status: CP
Start: 2019-09-01 — End: ?
  Administered 2019-09-01: 15:00:00 40 meq via ORAL

## 2019-09-01 NOTE — Anesthesia Post-Procedure Evaluation
Post-Anesthesia Evaluation    Name: Brittany Mccormick      MRN: [id number]     DOB: Jun 27, 1956     Age: 63 y.o.     Sex: female   __________________________________________________________________________     Procedure Information     Anesthesia Start Date/Time: 08/31/19 1700    Procedure: ENDOSCOPIC RETROGRADE CHOLANGIOPANCREATOGRAPHY WITH BIOPSY (N/A )    Location: ENDO 1 / ENDO/GI    Surgeon: Gordy Savers, MD          Post-Anesthesia Vitals  BP: 159/73 (12/10 1805)  Temp: 36.5 C (97.7 F) (12/10 1745)  Pulse: 82 (12/10 1805)  Respirations: 15 PER MINUTE (12/10 1805)  SpO2: 100 % (12/10 1805)  SpO2 Pulse: 81 (12/10 1805)   Vitals Value Taken Time   BP 159/73 08/31/19 1805   Temp 36.5 C (97.7 F) 08/31/19 1745   Pulse 82 08/31/19 1805   Respirations 15 PER MINUTE 08/31/19 1805   SpO2 100 % 08/31/19 1805         Post Anesthesia Evaluation Note    Evaluation location: Pre/Post  Patient participation: recovered; patient participated in evaluation  Level of consciousness: alert    Pain score: 0  Pain management: adequate    Hydration: normovolemia  Temperature: 36.0C - 38.4C  Airway patency: adequate    Perioperative Events       Post-op nausea and vomiting: no PONV    Postoperative Status  Cardiovascular status: hemodynamically stable  Respiratory status: spontaneous ventilation        Perioperative Events  Perioperative Event: No  Emergency Case Activation: No

## 2019-09-02 ENCOUNTER — Encounter: Admit: 2019-09-02 | Discharge: 2019-09-02 | Payer: MEDICARE

## 2019-09-02 MED ORDER — SENNOSIDES 8.6 MG PO TAB
2 | ORAL_TABLET | Freq: Every evening | ORAL | 0 refills | Status: CN | PRN
Start: 2019-09-02 — End: ?

## 2019-09-02 MED ORDER — POLYETHYLENE GLYCOL 3350 17 GRAM PO PWPK
17 g | Freq: Every day | ORAL | 0 refills | Status: CN | PRN
Start: 2019-09-02 — End: ?

## 2019-09-02 MED ORDER — BUMETANIDE 2 MG PO TAB
2 mg | ORAL_TABLET | Freq: Every day | ORAL | 0 refills | Status: AC
Start: 2019-09-02 — End: ?
  Filled 2019-09-02: qty 30, 30d supply, fill #1

## 2019-09-02 MED ORDER — METRONIDAZOLE 500 MG PO TAB
500 mg | ORAL_TABLET | Freq: Three times a day (TID) | ORAL | 0 refills | Status: AC
Start: 2019-09-02 — End: ?
  Filled 2019-09-02: qty 9, 3d supply, fill #1

## 2019-09-02 MED ORDER — ENOXAPARIN 40 MG/0.4 ML SC SYRG
40 mg | Freq: Every day | SUBCUTANEOUS | 0 refills | Status: DC
Start: 2019-09-02 — End: 2019-09-03

## 2019-09-02 MED ORDER — BISACODYL 10 MG RE SUPP
10 mg | Freq: Once | RECTAL | 0 refills | Status: CP
Start: 2019-09-02 — End: ?
  Administered 2019-09-02: 18:00:00 10 mg via RECTAL

## 2019-09-02 MED ORDER — MAGNESIUM SULFATE IN D5W 1 GRAM/100 ML IV PGBK
1 g | INTRAVENOUS | 0 refills | Status: CP
Start: 2019-09-02 — End: ?
  Administered 2019-09-02: 17:00:00 1 g via INTRAVENOUS

## 2019-09-02 MED ORDER — LEVOFLOXACIN 750 MG PO TAB
750 mg | ORAL_TABLET | Freq: Every day | ORAL | 0 refills | 7.00000 days | Status: AC
Start: 2019-09-02 — End: ?
  Filled 2019-09-02: qty 3, 3d supply, fill #1

## 2019-09-02 MED ORDER — POLYETHYLENE GLYCOL 3350 17 GRAM PO PWPK
2 | Freq: Once | ORAL | 0 refills | Status: CP
Start: 2019-09-02 — End: ?
  Administered 2019-09-02: 18:00:00 34 g via ORAL

## 2019-09-02 MED ORDER — CLOPIDOGREL 75 MG PO TAB
75 mg | ORAL_TABLET | Freq: Every day | ORAL | 0 refills | Status: CN
Start: 2019-09-02 — End: ?

## 2019-09-05 ENCOUNTER — Encounter: Admit: 2019-09-05 | Discharge: 2019-09-05 | Payer: MEDICARE

## 2019-09-05 DIAGNOSIS — I255 Ischemic cardiomyopathy: Secondary | ICD-10-CM

## 2019-09-05 DIAGNOSIS — Z9981 Dependence on supplemental oxygen: Secondary | ICD-10-CM

## 2019-09-05 DIAGNOSIS — I219 Acute myocardial infarction, unspecified: Secondary | ICD-10-CM

## 2019-09-05 DIAGNOSIS — I724 Aneurysm of artery of lower extremity: Secondary | ICD-10-CM

## 2019-09-05 DIAGNOSIS — I739 Peripheral vascular disease, unspecified: Secondary | ICD-10-CM

## 2019-09-05 DIAGNOSIS — Z8679 Personal history of other diseases of the circulatory system: Secondary | ICD-10-CM

## 2019-09-05 DIAGNOSIS — Z87891 Personal history of nicotine dependence: Secondary | ICD-10-CM

## 2019-09-05 DIAGNOSIS — J95821 Acute postprocedural respiratory failure: Secondary | ICD-10-CM

## 2019-09-05 DIAGNOSIS — M109 Gout, unspecified: Secondary | ICD-10-CM

## 2019-09-05 DIAGNOSIS — I469 Cardiac arrest, cause unspecified: Secondary | ICD-10-CM

## 2019-09-05 DIAGNOSIS — E785 Hyperlipidemia, unspecified: Principal | ICD-10-CM

## 2019-09-05 DIAGNOSIS — M1990 Unspecified osteoarthritis, unspecified site: Secondary | ICD-10-CM

## 2019-09-05 DIAGNOSIS — I2510 Atherosclerotic heart disease of native coronary artery without angina pectoris: Secondary | ICD-10-CM

## 2019-09-05 DIAGNOSIS — R200 Anesthesia of skin: Secondary | ICD-10-CM

## 2020-01-05 IMAGING — CT Thorax^1_PE (Adult)
1 series · 14 of 16 positions shown, 18 images · non-contrast
Comparison: none

[Series 4: pe 3.0 soft tissue · axial · 0.59mm/px · z∈[-253,-25]mm · 14 of 88 slices shown, 18 images]
[im 6/88  soft-tissue]
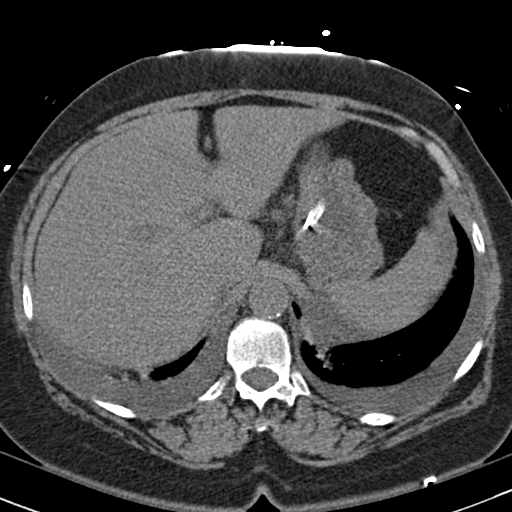
[im 6/88  lung]
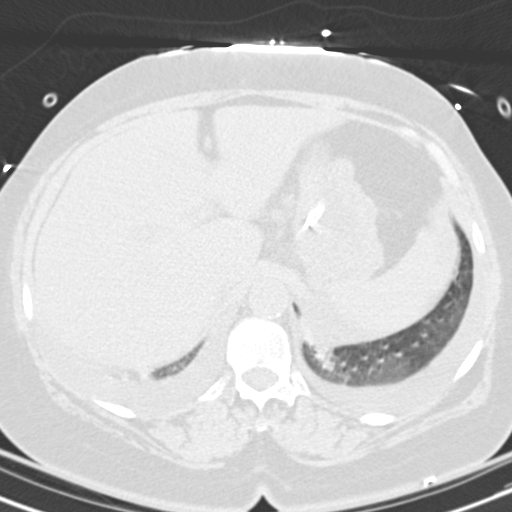
[im 12/88  lung]
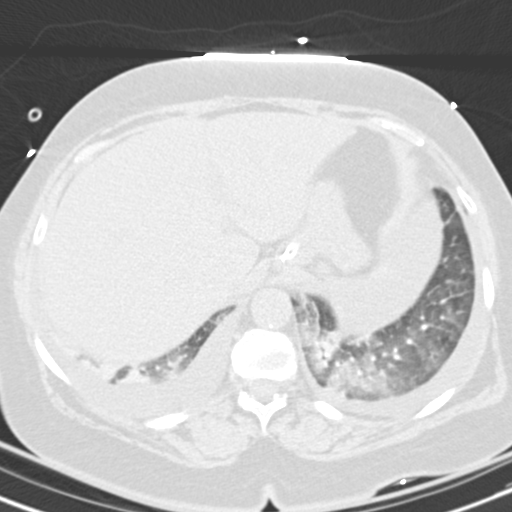
[im 18/88  lung]
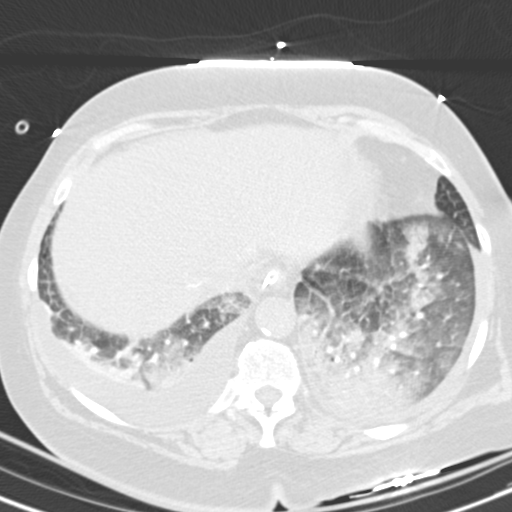
[im 24/88  lung]
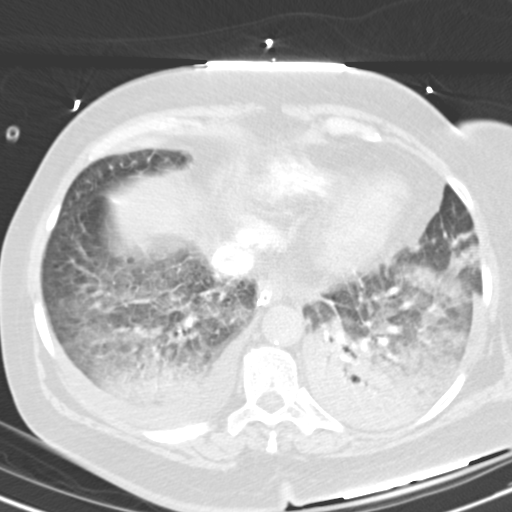
[im 30/88  soft-tissue]
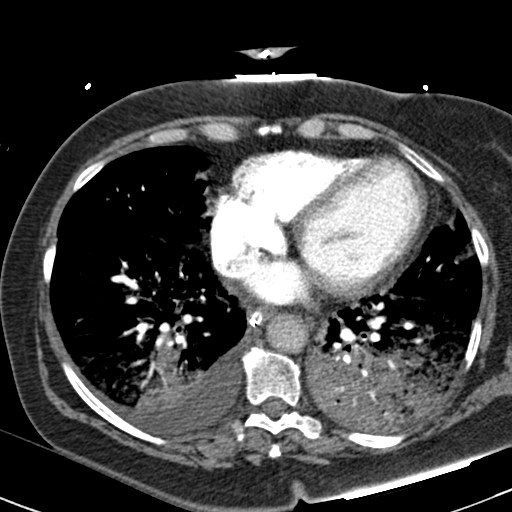
[im 30/88  lung]
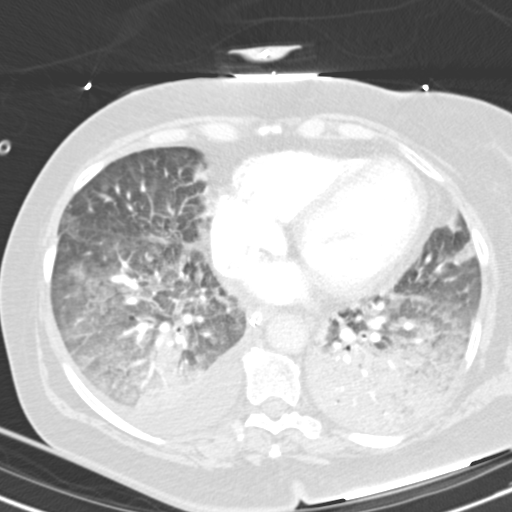
[im 35/88  lung]
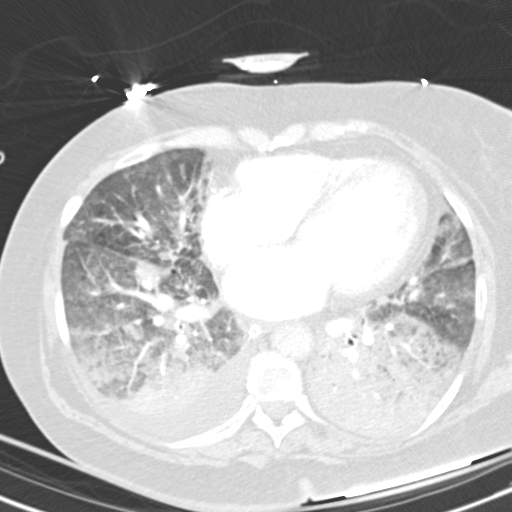
[im 41/88  lung]
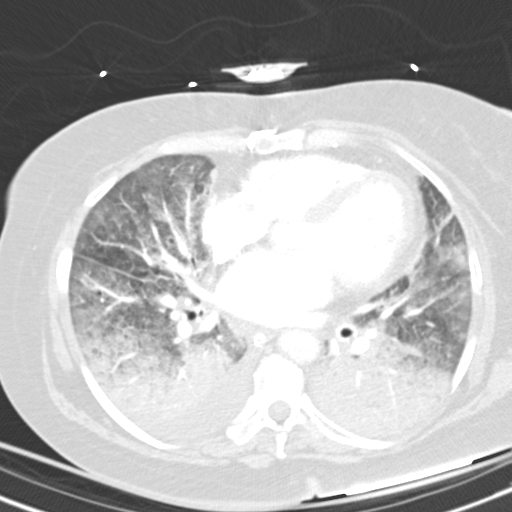
[im 47/88  lung]
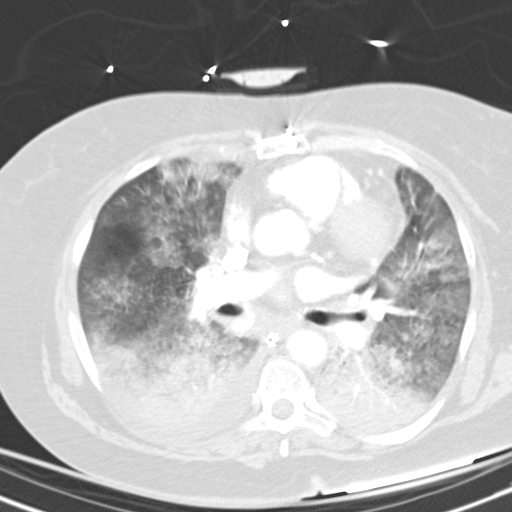
[im 53/88  soft-tissue]
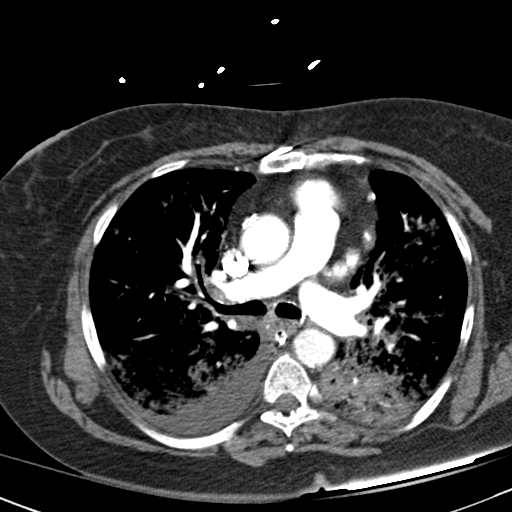
[im 53/88  lung]
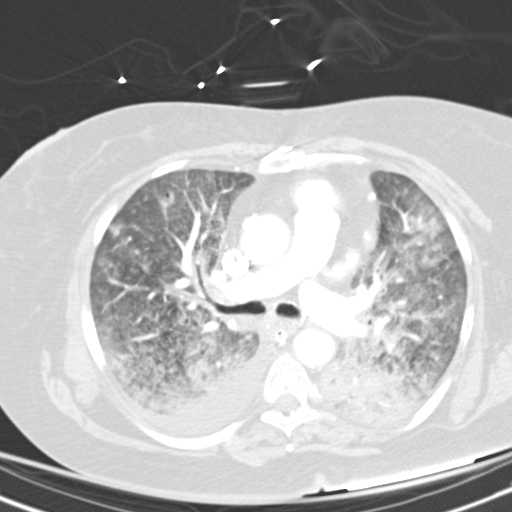
[im 59/88  lung]
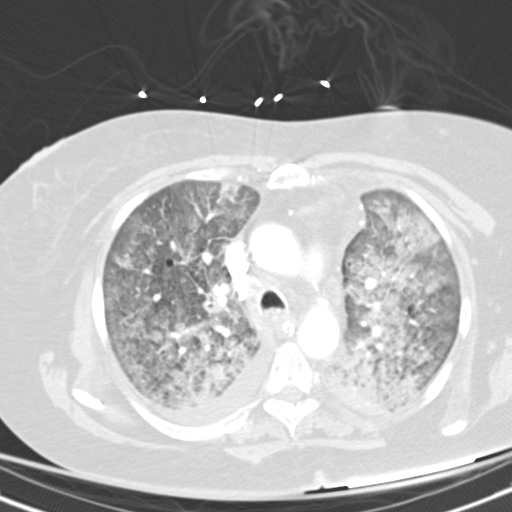
[im 64/88  lung]
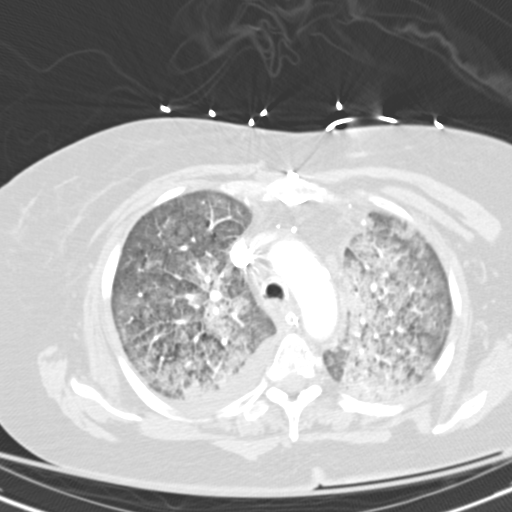
[im 70/88  lung]
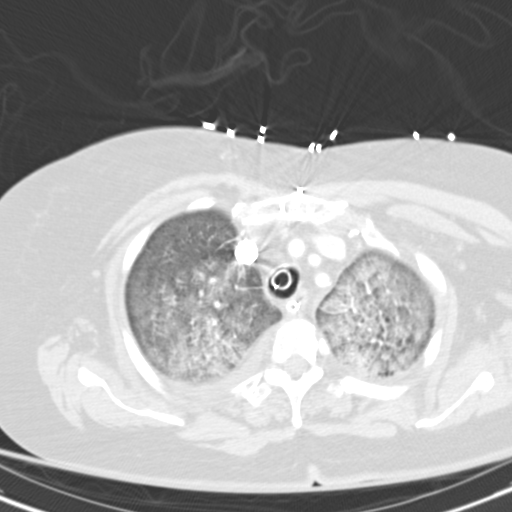
[im 76/88  soft-tissue]
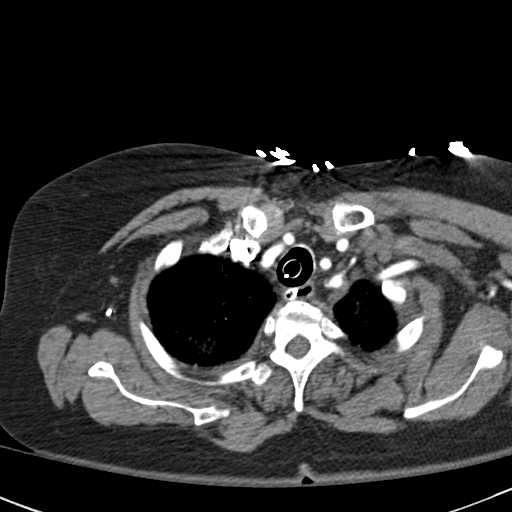
[im 76/88  lung]
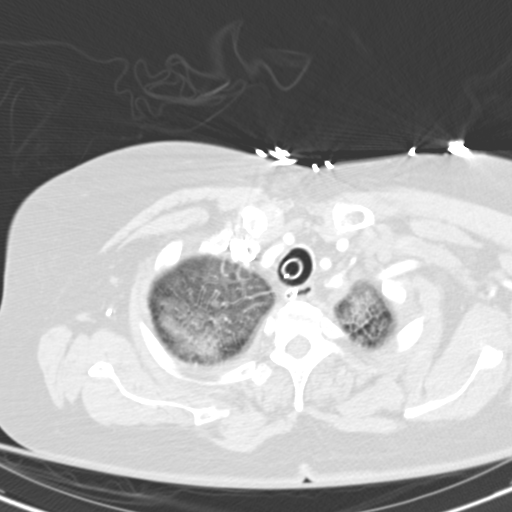
[im 82/88  lung]
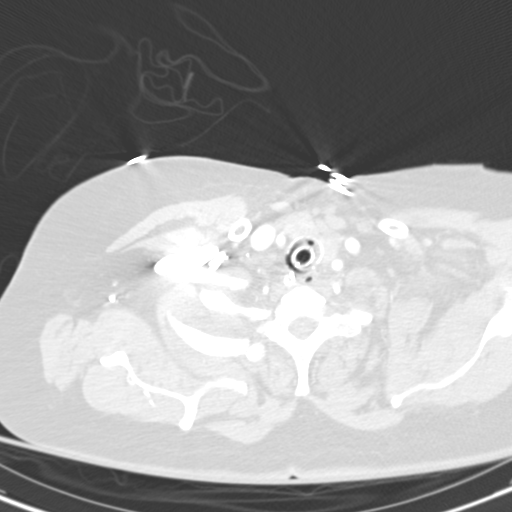

[14 of 16 positions shown; findings below may reference images not displayed]

DIAGNOSTIC STUDIES

EXAM
CT angiogram of the chest.

INDICATION
? PE
pt. intubated. concern for PE. 100ml [id number]

TECHNIQUE
All CT scans at this facility use dose modulation, iterative reconstruction, and/or weight based
dosing when appropriate to reduce radiation dose to as low as reasonably achievable.

COMPARISONS
None available

FINDINGS
No thoracic adenopathy is seen. The patient is intubated with ET tube projecting in the mid
trachea. NG tube is in place. There is cardiomegaly with calcification of coronary and prior
sternotomy. No concerning osseous lesions are seen.
No filling defects are seen to indicate pulmonary emboli. Extensive pulmonary infiltrates are noted
bilaterally involving the lungs diffusely greatest in the lung bases with interstitial prominence
and small bilateral pleural effusions. Differential considerations would include pneumonia,
aspiration, pulmonary hemorrhage, and fluid overload/congestive failure or combination of the above.
Conceivably drug reaction or acute hypersensitivity reaction could contribute to some of the
findings as well.

IMPRESSION
Extensive 5 lobe infiltrates greatest in the lung bases with bilateral pleural effusions. Primary
consideration would be fluid overload/ congestive failure and possible superimposed pneumonia or
aspiration. Pulmonary hemorrhage could account for some of the findings as well. Please see above
differential considerations.
Prior sternotomy with NG tube and ET tube in place. Case and findings were discussed by phone with
Dr. Strozier at [DATE] p.m..

Tech Notes:

## 2020-06-28 ENCOUNTER — Encounter: Admit: 2020-06-28 | Discharge: 2020-06-28 | Payer: MEDICARE

## 2020-06-28 MED ORDER — POTASSIUM CHLORIDE 20 MEQ PO TBTQ
ORAL_TABLET | Freq: Every day | 3 refills
Start: 2020-06-28 — End: ?

## 2020-06-28 MED ORDER — SPIRONOLACTONE 25 MG PO TAB
ORAL_TABLET | Freq: Every day | 3 refills
Start: 2020-06-28 — End: ?

## 2022-05-27 ENCOUNTER — Encounter: Admit: 2022-05-27 | Discharge: 2022-05-27 | Payer: MEDICARE

## 2022-06-02 ENCOUNTER — Encounter: Admit: 2022-06-02 | Discharge: 2022-06-02 | Payer: MEDICARE

## 2022-07-14 ENCOUNTER — Encounter: Admit: 2022-07-14 | Discharge: 2022-07-14 | Payer: MEDICARE

## 2022-07-17 ENCOUNTER — Encounter: Admit: 2022-07-17 | Discharge: 2022-07-17 | Payer: MEDICARE

## 2022-07-17 NOTE — Progress Notes
Records Request-URGENT    Medical records request for continuation of care:    Patient has appointment   with  Dr. Ricard Dillon    Please fax records to [facility]    Request records:    [place]  1956/09/15    Office Notes (pcp most recent)    Recent Labs-including most recent lipid       Thank you,      [facility]  [address] [place] [address]  Phone:  [phone]  Fax:  [phone]

## 2022-07-20 ENCOUNTER — Encounter: Admit: 2022-07-20 | Discharge: 2022-07-20 | Payer: MEDICARE

## 2022-07-20 DIAGNOSIS — R200 Anesthesia of skin: Secondary | ICD-10-CM

## 2022-07-20 DIAGNOSIS — E785 Hyperlipidemia, unspecified: Secondary | ICD-10-CM

## 2022-07-20 DIAGNOSIS — Z9981 Dependence on supplemental oxygen: Secondary | ICD-10-CM

## 2022-07-20 DIAGNOSIS — I255 Ischemic cardiomyopathy: Secondary | ICD-10-CM

## 2022-07-20 DIAGNOSIS — J95821 Acute postprocedural respiratory failure: Secondary | ICD-10-CM

## 2022-07-20 DIAGNOSIS — I219 Acute myocardial infarction, unspecified: Secondary | ICD-10-CM

## 2022-07-20 DIAGNOSIS — Z87891 Personal history of nicotine dependence: Secondary | ICD-10-CM

## 2022-07-20 DIAGNOSIS — I2510 Atherosclerotic heart disease of native coronary artery without angina pectoris: Secondary | ICD-10-CM

## 2022-07-20 DIAGNOSIS — K922 Gastrointestinal hemorrhage, unspecified: Secondary | ICD-10-CM

## 2022-07-20 DIAGNOSIS — Z8679 Personal history of other diseases of the circulatory system: Secondary | ICD-10-CM

## 2022-07-20 DIAGNOSIS — I469 Cardiac arrest, cause unspecified: Secondary | ICD-10-CM

## 2022-07-20 DIAGNOSIS — I739 Peripheral vascular disease, unspecified: Secondary | ICD-10-CM

## 2022-07-20 DIAGNOSIS — I724 Aneurysm of artery of lower extremity: Secondary | ICD-10-CM

## 2022-07-20 DIAGNOSIS — M1990 Unspecified osteoarthritis, unspecified site: Secondary | ICD-10-CM

## 2022-07-20 DIAGNOSIS — D649 Anemia, unspecified: Secondary | ICD-10-CM

## 2022-07-20 DIAGNOSIS — M109 Gout, unspecified: Secondary | ICD-10-CM

## 2022-07-21 ENCOUNTER — Encounter: Admit: 2022-07-21 | Discharge: 2022-07-21 | Payer: MEDICARE

## 2022-07-21 DIAGNOSIS — E785 Hyperlipidemia, unspecified: Secondary | ICD-10-CM

## 2022-07-21 DIAGNOSIS — I739 Peripheral vascular disease, unspecified: Secondary | ICD-10-CM

## 2022-07-21 DIAGNOSIS — J95821 Acute postprocedural respiratory failure: Secondary | ICD-10-CM

## 2022-07-21 DIAGNOSIS — I480 Paroxysmal atrial fibrillation: Secondary | ICD-10-CM

## 2022-07-21 DIAGNOSIS — D649 Anemia, unspecified: Secondary | ICD-10-CM

## 2022-07-21 DIAGNOSIS — Z87891 Personal history of nicotine dependence: Secondary | ICD-10-CM

## 2022-07-21 DIAGNOSIS — Z9981 Dependence on supplemental oxygen: Secondary | ICD-10-CM

## 2022-07-21 DIAGNOSIS — Z95810 Presence of automatic (implantable) cardiac defibrillator: Secondary | ICD-10-CM

## 2022-07-21 DIAGNOSIS — E782 Mixed hyperlipidemia: Secondary | ICD-10-CM

## 2022-07-21 DIAGNOSIS — I255 Ischemic cardiomyopathy: Secondary | ICD-10-CM

## 2022-07-21 DIAGNOSIS — Z136 Encounter for screening for cardiovascular disorders: Secondary | ICD-10-CM

## 2022-07-21 DIAGNOSIS — M109 Gout, unspecified: Secondary | ICD-10-CM

## 2022-07-21 DIAGNOSIS — M1990 Unspecified osteoarthritis, unspecified site: Secondary | ICD-10-CM

## 2022-07-21 DIAGNOSIS — Z8679 Personal history of other diseases of the circulatory system: Secondary | ICD-10-CM

## 2022-07-21 DIAGNOSIS — I2510 Atherosclerotic heart disease of native coronary artery without angina pectoris: Secondary | ICD-10-CM

## 2022-07-21 DIAGNOSIS — I219 Acute myocardial infarction, unspecified: Secondary | ICD-10-CM

## 2022-07-21 DIAGNOSIS — I25118 Atherosclerotic heart disease of native coronary artery with other forms of angina pectoris: Secondary | ICD-10-CM

## 2022-07-21 DIAGNOSIS — I469 Cardiac arrest, cause unspecified: Secondary | ICD-10-CM

## 2022-07-21 DIAGNOSIS — I724 Aneurysm of artery of lower extremity: Secondary | ICD-10-CM

## 2022-07-21 DIAGNOSIS — K922 Gastrointestinal hemorrhage, unspecified: Secondary | ICD-10-CM

## 2022-07-21 DIAGNOSIS — R200 Anesthesia of skin: Secondary | ICD-10-CM

## 2022-07-21 NOTE — Assessment & Plan Note
I think the [employer] ICD was implanted at [facility] after she presented with SCD.  Her ICD is currently followed by [facility].

## 2022-07-21 NOTE — Telephone Encounter
Per request - Pt is now enrolled in [facility] remote monitoring clinic. No further action required.    Brittany Mccormick  P Mac Ep Remote  We will need to assume management of ICD from [facility]   Thanks

## 2022-07-21 NOTE — Assessment & Plan Note
She reports a hx of PAF for which she was started on DOACs.  Both Xarelto and Eliquis seem to have caused life-threatening GI bleeding complications.

## 2022-07-29 ENCOUNTER — Encounter: Admit: 2022-07-29 | Discharge: 2022-07-29 | Payer: MEDICARE

## 2022-07-29 DIAGNOSIS — I255 Ischemic cardiomyopathy: Secondary | ICD-10-CM

## 2022-07-29 DIAGNOSIS — I10 Essential (primary) hypertension: Secondary | ICD-10-CM

## 2022-07-29 DIAGNOSIS — R799 Abnormal finding of blood chemistry, unspecified: Secondary | ICD-10-CM

## 2022-07-29 NOTE — Progress Notes
[facility] Navigation Intake Assessment Document    Patient Name:  Brittany Mccormick  MRN:  [id number]  DOB:  [birth year], 66 y.o.  Primary contact for patient: [phone]   Insurance:   Payor: UHC MEDICARE / Plan: [employer] COMMUNITY PLAN SNP - [place] / Product Type: Medicare /    Insurance reviewed: Registration completed by [employer]    Referring Physician Info (Cardiology): Danella Maiers, MD [place] C[facility]    Diagnosis & Reason for Visit: Received routine internal referral to CVM: Advanced Heart Failure for dx: management of HFrEF.    Appointment Plan: Patient scheduled with first available HF physician. She prefers [facility] location.    Future Appointments   Date Time Provider Department Center   08/07/2022  3:00 PM Doylene Canard, MD [facility] C[facility] Exam       Care Coordination: New Patient Labs    07/29/2022 11:04 AM - LM for patient regarding labwork. Requested that she check mychart and to call back with any questions.    Appointment confirmation and resources sent to patient via Mychart.    Cardiomyopathy Summary:      Initial HF Diagnosis Date: first CAD event 2001, first drop in LVEF 10/22/2018 at [facility]     Summary of Recent Testing/Procedures Pertinent to Diagnosis:   Test/  Procedure Date/  Facility Results   Most Recent TTE 04/21/22 at [place]     Report Location: Requested    Image Location: Requested      Most recent Ischemic Eval: LHC 10/27/18 at [place] IMPRESSION:    1. Severe 3-vessel coronary artery disease as described above.  2. Patent left main and proximal left circumflex stent as well as mid left circumflex stent.  Unchanged from intervention in July 2013.  3. Patent saphenous vein graft to distal right coronary artery.  4. Patent left internal mammary artery to left anterior descending artery.  5. 100% saphenous vein graft occlusion to an obtuse marginal branch (previous angiography in July 2019 revealed severe diffuse disease with poor flow and subtending a small territory).  6. This may be the culprit for the patient's small non-ST-elevation myocardial infarction, but it is not amenable to revascularization.  7. Normal left ventricular end-diastolic pressure.  ?  RECOMMENDATION:  Aggressive medical therapy including smoking cessation.    Report Location: In House       Etiology: Ischemic - s/p CABG and multiple PCI     Current GDMT (for LVEF <35%):    BB: Toprol XL    ACE-I/ARB/ARNI: None    MRA: Aldactone    SGLT-2 Inhibitor: None    Hydral/Isord: None    Diuretic: Bumex    Cardiac Devices: ICD    Hospitalizations/ER Visits in past 12 months:   ? [facility] - requesting records    Cardiac Rehab: in 2019 was referred to Cardiac Rehab at [facility]    BMI: Estimated body mass index is 31.93 kg/m? as calculated from the following:    Height as of 07/21/22: 142.2 cm (4' 8).    Weight as of 07/21/22: 64.6 kg (142 lb 6.4 oz).    Medical History:    Location of Records:   ? Care Everywhere: Yes - [facility]  ? Scan Viewer/Outside Records: Yes - outside labs, hospital records    Patient Care Team:  [place], PA-C as PCP - General (Family Medicine)  Micheline Rough, MD  Wilkie Aye Evlyn Courier, MD  Doroteo Bradford, RN (Cardiovascular Disease)    Patient Active  Problem List    Diagnosis Date Noted   ? Paroxysmal atrial fibrillation (HCC) 07/21/2022   ? Anemia 07/20/2022   ? GI bleed 07/20/2022   ? Leukocytosis 10/24/2018   ? NSVT (nonsustained ventricular tachycardia) (HCC) 10/24/2018   ? AKI (acute kidney injury) (HCC) 10/24/2018   ? Acute respiratory failure with hypoxia (HCC) 10/22/2018   ? Acute on chronic systolic heart failure (HCC) 04/07/2018   ? COPD (chronic obstructive pulmonary disease) (HCC) 04/07/2018   ? Anxiety 04/07/2018   ? ICD (implantable cardioverter-defibrillator), dual, 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     Manufacturer: [employer]  ? ?  Device Type:  DDD-ICD ? ?  Model Number: RESONATE EL ICD DR Model [id number] ? ?  Serial Number: [id number] ? ?  Implant Date: 04/04/18 ? ?  Investigational: No ? ?  Wireless:  Yes ? ?  MRI Conditional:  Yes           ? Ischemic cardiomyopathy 04/01/2018     04/21/22 Echo:  Moderate LV systolic dysfunction, LVEF 30-35% Grade 3 diastolic dysfunction.  Mild to moderate mitral valve regurgitation. Moderate pulmonary HTN, PAP-51     ? History of cardiac arrest with ventricular fibrillation (HCC) 04/01/2018   ? Pulmonary edema 03/31/2018   ? 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       12/03 - CABG x 4 ([facility]): LIMA to LAD, SVG to distal RCA, SSVG-OM1, OM2.     ? CAD (coronary artery disease), native coronary artery 04/24/2009       5/01 - Non-Q MI-transferred to [facility]        5/01 - Cardiac cath: 40% distal LM, 80% narrowing of small DX, 50-60% proximal                   LAD, LCX 30-50% in OM1, 70% in OM2 distal LCX 50-60%, RCA had                   high-grade stenosis with appearance of ruptured plaque in proximal RCA        5/01 - PTCA/stent to RCA.        1/03 - PTCA/stent to LAD [facility]): 2.5x12 SciMed stent; 30% LM at ostium and 30-40%                   at distal portion of LM; 1LAD proximal 80-90%, status post PCI and stent;                   three tiny DXs without any lesions; LCX 30% at prox portion with diffuse, mild,                   luminal irregularities throughout 3 OMs; RCA 30-40% in distal portion with luminal                  irregularities, LVEF 65% with normal LV wall motion.        5/03 - PCI/brachytherapy to LAD; EF 40%, distal lt main 40% (at bifurcation                   to LAD and LCX), prox 1st Dx 20%, prox posterolateral branch 30%,                   prox LAD 90% (in-stent), mid LAD 30%, RCA stent patent with 30%                   irregularities  distally        12/03 - Admit [place] for cath d/t recurrent chest pressure; cath showed severe 3-                   vessel disease.  Surgical consult.        12/03 - CABG x 4 ([facility]): LIMA to LAD, SVG to distal RCA, SSVG-OM1, OM2.         07/19 - PCI to mid circumflex    10/27/18: Cath after small NSTEMI:  8. Severe 3-vessel coronary artery disease as described above.  9. Patent left main and proximal left circumflex stent as well as mid left circumflex stent.  Unchanged from intervention in July 2013.  10. Patent saphenous vein graft to distal right coronary artery.  11. Patent left internal mammary artery to left anterior descending artery.  12. 100% saphenous vein graft occlusion to an obtuse marginal branch (previous angiography in July 2019 revealed severe diffuse disease with poor flow and subtending a small territory).  13. This may be the culprit for the patient's small non-ST-elevation myocardial infarction, but it is not amenable to revascularization.     ? Hyperlipidemia 04/24/2009      5/01 - FLP: Total 168, Trigs 125, HDL 43, LDL 100.         02/01/00 - Zocor started.         9/01 - Total 115, Trigs 56, HDL 45, LDL 59.  Initiated Lipitor 20mg .         10/01 - Discontinue Lipitor and recheck lab values in 3-4 months.     ? Peripheral vascular disease (HCC) 04/24/2009     Inability to advance up right femoral artery, hence procedure done on left side.        3/03 - Distal left common iliac 50-70%, mid right SFA 50%, Lt 70-80% stenosis of                    external iliac, mild disease in SFA with an occlusion of posterior tibial;                   PTA of right common iliac using a 6.0 x 29 Genesis stent, left common iliac                   PTA with a 5.0 x 28 CIQ stent.         5/04 - Abdominal aortic angiography with bilateral ileo-femoral runoff.  PTA/stent to                   left external iliac artery (re-cannulization of total occlusion; 6.0- x 100-mm                   Smart stent device deployed and post dilated in its mid and distal portions with                   5.0-mm balloon; post dilated proximally with a 6.0-mm balloon).           6/04 - LE venous duplex for limb swelling/numbness: Mild common femoral vein reflux.                    Otherwise unremarkable study.  No evidence for CVT.          10/07 - Attempted PTA for left iliac artery occlusion.  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 History of atrial fibrillation 04/24/2009      1/04 - Post-operative a-fib. Converted with IV amiodarone     ? Tobacco abuse 04/24/2009       Medical History:   Diagnosis Date   ? Anemia 07/20/2022   ? Arthritis    ? CAD (coronary artery disease), native coronary artery 04/24/2009      5/01 - Non-Q MI-transferred to [facility]       5/01 - Cardiac cath: 40% distal LM, 80% narrowing of small DX, 50-60% proximal                  LAD, LCX 30-50% in OM1, 70% in OM2 distal LCX 50-60%, RCA had                  high-grade stenosis with appearance of ruptured plaque in proximal RCA       5/01 - PTCA/stent to RCA.       1/03 - PTCA/stent to LAD [facility]): 2.5x12 SciMed stent; 30%    ? Cardiac arrest with ventricular fibrillation (HCC) 03/2018   ? Femoral artery pseudo-aneurysm, right (HCC) 04/24/2009   ? GI bleed 07/20/2022   ? Gout    ? Heart attack (HCC)     x4, most recent 03/03/2018   ? History of atrial fibrillation 04/24/2009   ? History of tobacco use 04/24/2009   ? Hyperlipemia    ? Hyperlipidemia 04/24/2009   ? Ischemic cardiomyopathy 04/01/2018   ? Neuropathy - (NOS)    ? Numbness of toes    ? On home oxygen therapy    ? Peripheral vascular disease (HCC) 04/24/2009   ? Pulmonary artery, stenosis    ? Respiratory failure, post-operative (HCC) 04/24/2009       Surgical History:   Procedure Laterality Date   ? INSERTION/ REPLACEMENT IMPLANTABLE DEFIBRILLATOR SYSTEM AND LEAD Left 04/04/2018    Performed by Smith Robert, MD at [facility] EP LAB   ? FLUOROSCOPY - CARDIAC  04/04/2018    Performed by Smith Robert, MD at [facility] EP LAB   ? ELECTROPHYSIOLOGIC EVALUATION IMPLANTED PACING CARDIOVERTER/ DEFIBRILLATOR LEADS AT TIME OF IMPLANTATION  04/04/2018    Performed by Smith Robert, MD at [facility] EP LAB   ? ESOPHAGOGASTRODUODENOSCOPY WITH ENDOSCOPIC ULTRASOUND EXAMINATION - FLEXIBLE N/A 08/28/2019 Performed by Comer Locket, MD at [facility] ENDO   ? ENDOSCOPIC RETROGRADE CHOLANGIOPANCREATOGRAPHY WITH BIOPSY N/A 08/31/2019    Performed by Comer Locket, MD at [facility] ENDO   ? HC C-SECTION DELIVERY      x2   ? HX CHOLECYSTECTOMY     ? HX CORONARY STENT PLACEMENT      last count was 16 stents   ? HX PTCA      x11   ? PR CARDIOT EXPL RMVL FB ATR/VENTR THRMB CARD BYP      quadruple bypass and vascular LLE bypass       Family History   Problem Relation Age of Onset   ? Coronary Artery Disease Mother    ? Premature Heart Disease Mother    ? Heart Disease Mother    ? Heart Attack Mother    ? Stroke Father    ? Premature Heart Disease Father    ? Heart murmur Brother    ? Heart Attack Brother        Social History     Socioeconomic History   ?  Marital status: Divorced   Tobacco Use   ? Smoking status: Former     Packs/day: 0     Types: Cigarettes     Quit date: 02/28/2018     Years since quitting: 4.4   ? Smokeless tobacco: Never   Vaping Use   ? Vaping Use: Never used   Substance and Sexual Activity   ? Alcohol use: Yes     Comment: on occasion   ? Drug use: Yes     Types: Marijuana     Comment: marijuana       Allergies   Allergen Reactions   ? Amoxicillin      Allergy recorded in SMS: Amoxicillin~Reactions: MUSCLE PAIN   ? Codeine NAUSEA AND VOMITING     Allergy recorded in SMS: Codeine~Reactions: VOMITING   ? Darvocet [Propoxyphene N-Acetaminophen] RASH and ITCHING   ? Diphenhydramine Hcl      Allergy recorded in SMS: BENADRYL~Reactions: SLEEP WALKING   ? Oxycodone RASH and ITCHING   ? Percocet [Oxycodone-Acetaminophen] RASH and ITCHING   ? Vicodin [Hydrocodone-Acetaminophen] RASH and ITCHING   ? Atorvastatin NAUSEA AND VOMITING   ? Maxidex [Dexamethasone] ITCHING     Glass vein   ? Maxzide [Triamterene-Hydrochlorothiazid]    ? Lasix [Furosemide] NAUSEA AND VOMITING   ? Morphine NAUSEA AND VOMITING         Current Outpatient Medications:   ?  acetaminophen (TYLENOL) 325 mg tablet, Take one tablet by mouth every 4 hours as needed for Pain., Disp: , Rfl:   ?  albuterol (VENTOLIN HFA, PROAIR HFA) 90 mcg/actuation inhaler, Inhale one puff to two puffs by mouth into the lungs as Needed for Wheezing., Disp: , Rfl:   ?  albuterol-ipratropium (DUONEB) 0.5 mg-3 mg(2.5 mg base)/3 mL nebulizer solution, , Disp: , Rfl:   ?  allopurinoL (ZYLOPRIM) 100 mg tablet, Take one tablet by mouth daily. Take with food., Disp: , Rfl:   ?  Aspirin 81 mg Tab, Take one tablet by mouth daily., Disp: , Rfl:   ?  atorvastatin (LIPITOR) 40 mg tablet, Take one tablet by mouth daily., Disp: , Rfl:   ?  bumetanide (BUMEX) 2 mg tablet, Take one tablet by mouth daily., Disp: 30 tablet, Rfl: 0  ?  clopiDOGrel (PLAVIX) 75 mg tablet, Take one tablet by mouth daily., Disp: 90 tablet, Rfl: 0  ?  colchicine (COLCRYS) 0.6 mg tablet, Take one tablet by mouth twice daily., Disp: , Rfl:   ?  diclofenac sodium (VOLTAREN) 1 % topical gel, Apply four g topically to affected area four times daily., Disp: , Rfl:   ?  FLUoxetine (PROZAC) 10 mg capsule, Take one capsule by mouth daily., Disp: , Rfl:   ?  fluticasone-umeclidin-vilanter (TRELEGY ELLIPTA) 100-62.5-25 mcg inhaler, Inhale one puff by mouth into the lungs daily., Disp: , Rfl:   ?  hydrOXYzine HCL (ATARAX) 25 mg tablet, Take one tablet by mouth twice daily., Disp: , Rfl:   ?  LYRICA 100 mg capsule, Take one capsule by mouth three times daily., Disp: , Rfl:   ?  metoprolol XL (TOPROL XL) 25 mg extended release tablet, Take one tablet by mouth daily., Disp: 90 tablet, Rfl: 3  ?  pantoprazole DR (PROTONIX) 40 mg tablet, Take one tablet by mouth daily., Disp: , Rfl:   ?  potassium chloride SR (K-DUR) 20 mEq tablet, Take one tablet by mouth daily. Take with a meal and a full glass of water. **Take one tablet by mouth  twice daily for three days, then resume previous dose.**, Disp: 90 tablet, Rfl: 3  ?  spironolactone (ALDACTONE) 25 mg tablet, Take one tablet by mouth daily. Take with food., Disp: 90 tablet, Rfl: 3    Tora Perches, RN, [facility]  Nurse Navigator  [facility]  Phone: [phone]  Fax: [phone]

## 2022-08-07 ENCOUNTER — Encounter: Admit: 2022-08-07 | Discharge: 2022-08-07 | Payer: MEDICARE

## 2022-08-26 ENCOUNTER — Encounter: Admit: 2022-08-26 | Discharge: 2022-08-26 | Payer: MEDICARE

## 2022-08-26 DIAGNOSIS — Z95810 Presence of automatic (implantable) cardiac defibrillator: Secondary | ICD-10-CM

## 2022-08-26 DIAGNOSIS — E782 Mixed hyperlipidemia: Secondary | ICD-10-CM

## 2022-08-26 DIAGNOSIS — I255 Ischemic cardiomyopathy: Secondary | ICD-10-CM

## 2022-08-26 DIAGNOSIS — I25118 Atherosclerotic heart disease of native coronary artery with other forms of angina pectoris: Secondary | ICD-10-CM

## 2022-08-26 NOTE — Progress Notes
Griffith Citron, RN  P Cvm Nurse [place]  Overdue for device programming(expected annually for pts ICDs ) there are no active orders (primary cardiology provider is responsible for device f/u orders.  SDO is listed as primary)    Looks like we recently transferred her remote monitoring to [place] but we have not checked her device since 2020    Please enter standing remote orders and order for in office ICD check as soon as reasonably possible.      Thank you in advance!!   -Kaylee/Device Team

## 2022-08-28 ENCOUNTER — Encounter: Admit: 2022-08-28 | Discharge: 2022-08-28 | Payer: MEDICARE

## 2022-08-29 ENCOUNTER — Encounter: Admit: 2022-08-29 | Discharge: 2022-08-29 | Payer: MEDICARE

## 2022-08-29 ENCOUNTER — Inpatient Hospital Stay: Admit: 2022-08-29 | Discharge: 2022-08-29 | Payer: MEDICARE

## 2022-08-29 ENCOUNTER — Inpatient Hospital Stay: Admit: 2022-08-29 | Payer: MEDICARE

## 2022-08-29 DIAGNOSIS — R0602 Shortness of breath: Secondary | ICD-10-CM

## 2022-08-29 LAB — IRON + BINDING CAPACITY + %SAT+ FERRITIN
% SATURATION: 5 % — ABNORMAL LOW (ref 28–42)
FERRITIN: 44 ng/mL (ref 10–200)
IRON BINDING: 413 ug/dL — ABNORMAL HIGH (ref 270–380)
IRON: 21 ug/dL — ABNORMAL LOW (ref 50–160)

## 2022-08-29 LAB — BLOOD GASES, ARTERIAL
BASE DEFICIT-ART: 0.6 MMOL/L
BICARB, ART(CAL): 23 MMOL/L (ref 21–28)
O2 SAT,ART(CALC.): 99 % — ABNORMAL HIGH (ref 95–99)
PCO2-ART: 32 mmHg — ABNORMAL LOW (ref 35–45)
PH-ART: 7.4 — ABNORMAL HIGH (ref 7.35–7.45)
PO2-ART: 139 mmHg — ABNORMAL HIGH (ref 80–100)

## 2022-08-29 LAB — HIGH SENSITIVITY TROPONIN I 2 HOUR
HIGH SENSITIVITY TROPONIN I 2 HOUR: 470 ng/L — ABNORMAL HIGH (ref <12)
HIGH SENSITIVITY TROPONIN I DELTA VALUE: 113 mg/dL — ABNORMAL HIGH (ref 0.3–1.2)

## 2022-08-29 LAB — URINALYSIS MICROSCOPIC REFLEX TO CULTURE

## 2022-08-29 LAB — COMPREHENSIVE METABOLIC PANEL
ALK PHOSPHATASE: 145 U/L — ABNORMAL HIGH (ref 25–110)
AST: 37 U/L (ref 7–40)
CREATININE: 1.2 mg/dL — ABNORMAL HIGH (ref 0.4–1.00)
POTASSIUM: 3.6 MMOL/L — ABNORMAL LOW (ref 3.5–5.1)
SODIUM: 143 MMOL/L — ABNORMAL LOW (ref 137–147)

## 2022-08-29 LAB — INFLUENZA A/B AND RSV PCR
FLU A: NEGATIVE
FLU B: NEGATIVE
RSV: NEGATIVE

## 2022-08-29 LAB — COVID-19 (SARS-COV-2) PCR

## 2022-08-29 LAB — URINALYSIS DIPSTICK REFLEX TO CULTURE
LEUKOCYTES: NEGATIVE
NITRITE: NEGATIVE
URINE ASCORBIC ACID, UA: NEGATIVE

## 2022-08-29 LAB — PHOSPHORUS: PHOSPHORUS: 3.3 mg/dL — ABNORMAL HIGH (ref 2.0–4.5)

## 2022-08-29 LAB — LIPASE: LIPASE: 83 U/L — ABNORMAL HIGH (ref 11–82)

## 2022-08-29 LAB — TSH WITH FREE T4 REFLEX: TSH: 1.7 uU/mL — ABNORMAL LOW (ref 0.35–5.00)

## 2022-08-29 LAB — PROTIME INR (PT): PROTIME: 18 s — ABNORMAL HIGH (ref 9.5–14.2)

## 2022-08-29 LAB — HIGH SENSITIVITY TROPONIN I 4 HR: HI SEN TNI 4 HR: 444 ng/L — ABNORMAL HIGH (ref <12)

## 2022-08-29 LAB — D-DIMER: D-DIMER: 400 ng{FEU}/mL — ABNORMAL HIGH (ref <500)

## 2022-08-29 LAB — HIGH SENSITIVITY TROPONIN I 0 HOUR: HIGH SENSITIVITY TROPONIN I 0 HOUR: 357 ng/L — ABNORMAL HIGH (ref <12)

## 2022-08-29 LAB — CBC AND DIFF: WBC COUNT: 12 K/UL — ABNORMAL HIGH (ref 4.5–11.0)

## 2022-08-29 LAB — LACTIC ACID (BG - RAPID LACTATE): LACTIC ACID(SYRINGE): 3.4 MMOL/L — ABNORMAL HIGH (ref 0.5–2.0)

## 2022-08-29 LAB — MAGNESIUM: MAGNESIUM: 1.2 mg/dL — ABNORMAL LOW (ref 1.6–2.6)

## 2022-08-29 LAB — PTT (APTT): PTT: 27 s — ABNORMAL HIGH (ref 24.0–36.5)

## 2022-08-29 MED ORDER — ALLOPURINOL 100 MG PO TAB
100 mg | Freq: Every day | ORAL | 0 refills | Status: AC
Start: 2022-08-29 — End: ?
  Administered 2022-08-30 (×2): 100 mg via ORAL

## 2022-08-29 MED ORDER — CLOPIDOGREL 75 MG PO TAB
75 mg | Freq: Every day | ORAL | 0 refills | Status: AC
Start: 2022-08-29 — End: ?
  Administered 2022-08-30 – 2022-09-04 (×7): 75 mg via ORAL

## 2022-08-29 MED ORDER — ONDANSETRON 4 MG PO TBDI
4 mg | ORAL | 0 refills | Status: AC | PRN
Start: 2022-08-29 — End: ?
  Administered 2022-08-30 – 2022-09-01 (×3): 4 mg via ORAL

## 2022-08-29 MED ORDER — POLYETHYLENE GLYCOL 3350 17 GRAM PO PWPK
1 | Freq: Every day | ORAL | 0 refills | Status: AC
Start: 2022-08-29 — End: ?

## 2022-08-29 MED ORDER — MAGNESIUM SULFATE IN D5W 1 GRAM/100 ML IV PGBK
1 g | INTRAVENOUS | 0 refills | Status: AC
Start: 2022-08-29 — End: ?
  Administered 2022-08-30: 08:00:00 1 g via INTRAVENOUS

## 2022-08-29 MED ORDER — PANTOPRAZOLE 40 MG PO TBEC
40 mg | Freq: Every day | ORAL | 0 refills | Status: AC
Start: 2022-08-29 — End: ?
  Administered 2022-08-30 – 2022-09-04 (×7): 40 mg via ORAL

## 2022-08-29 MED ORDER — IPRATROPIUM-ALBUTEROL 0.5 MG-3 MG(2.5 MG BASE)/3 ML IN NEBU
3 mL | RESPIRATORY_TRACT | 0 refills | Status: AC | PRN
Start: 2022-08-29 — End: ?

## 2022-08-29 MED ORDER — ATORVASTATIN 40 MG PO TAB
40 mg | Freq: Every day | ORAL | 0 refills | Status: AC
Start: 2022-08-29 — End: ?
  Administered 2022-08-30 – 2022-09-04 (×7): 40 mg via ORAL

## 2022-08-29 MED ORDER — MAGNESIUM SULFATE IN D5W 1 GRAM/100 ML IV PGBK
1 g | INTRAVENOUS | 0 refills | Status: AC
Start: 2022-08-29 — End: ?
  Administered 2022-08-30: 12:00:00 1 g via INTRAVENOUS

## 2022-08-29 MED ORDER — ASPIRIN 81 MG PO CHEW
81 mg | Freq: Every day | ORAL | 0 refills | Status: AC
Start: 2022-08-29 — End: ?
  Administered 2022-08-30 – 2022-09-04 (×7): 81 mg via ORAL

## 2022-08-29 MED ORDER — FLUOXETINE 10 MG PO CAP
10 mg | Freq: Every day | ORAL | 0 refills | Status: AC
Start: 2022-08-29 — End: ?
  Administered 2022-08-30 – 2022-09-04 (×7): 10 mg via ORAL

## 2022-08-29 MED ORDER — HEPARIN (PORCINE) BOLUS FOR CONTINUOUS INFUSION (VIAL) - APTT MAIN
20-40 [IU]/kg | INTRAVENOUS | 0 refills | Status: DC | PRN
Start: 2022-08-29 — End: 2022-08-30

## 2022-08-29 MED ORDER — ACETAMINOPHEN 325 MG PO TAB
650 mg | ORAL | 0 refills | Status: AC | PRN
Start: 2022-08-29 — End: ?
  Administered 2022-08-30 – 2022-09-03 (×2): 650 mg via ORAL

## 2022-08-29 MED ORDER — POTASSIUM CHLORIDE 20 MEQ PO TBTQ
40 meq | Freq: Once | ORAL | 0 refills | Status: CP
Start: 2022-08-29 — End: ?
  Administered 2022-08-30: 03:00:00 40 meq via ORAL

## 2022-08-29 MED ORDER — PREGABALIN 50 MG PO CAP
100 mg | Freq: Three times a day (TID) | ORAL | 0 refills | Status: AC
Start: 2022-08-29 — End: ?
  Administered 2022-08-30 (×3): 100 mg via ORAL

## 2022-08-29 MED ORDER — METOPROLOL SUCCINATE 25 MG PO TB24
25 mg | Freq: Every day | ORAL | 0 refills | Status: AC
Start: 2022-08-29 — End: ?
  Administered 2022-08-31: 14:00:00 25 mg via ORAL

## 2022-08-29 MED ORDER — SENNOSIDES 8.6 MG PO TAB
1 | Freq: Two times a day (BID) | ORAL | 0 refills | Status: AC
Start: 2022-08-29 — End: ?
  Administered 2022-08-30 – 2022-09-04 (×5): 1 via ORAL

## 2022-08-29 MED ORDER — UMECLIDINIUM-VILANTEROL 62.5-25 MCG/ACTUATION IN DSDV
1 | Freq: Every day | RESPIRATORY_TRACT | 0 refills | Status: AC
Start: 2022-08-29 — End: ?
  Administered 2022-09-01 – 2022-09-03 (×2): 1 via RESPIRATORY_TRACT

## 2022-08-29 MED ORDER — HEPARIN (PORCINE) 1,000 UNIT/ML IJ SOLN
70 [IU]/kg | Freq: Once | INTRAVENOUS | 0 refills | Status: CP
Start: 2022-08-29 — End: ?
  Administered 2022-08-30: 03:00:00 4320 [IU] via INTRAVENOUS

## 2022-08-29 MED ORDER — ENOXAPARIN 40 MG/0.4 ML SC SYRG
40 mg | Freq: Every day | SUBCUTANEOUS | 0 refills | Status: DC
Start: 2022-08-29 — End: 2022-08-30

## 2022-08-29 MED ORDER — ENOXAPARIN 40 MG/0.4 ML SC SYRG
40 mg | Freq: Every day | SUBCUTANEOUS | 0 refills | Status: AC
Start: 2022-08-29 — End: ?
  Administered 2022-08-31: 02:00:00 40 mg via SUBCUTANEOUS

## 2022-08-29 MED ORDER — MAGNESIUM SULFATE IN D5W 1 GRAM/100 ML IV PGBK
1 g | INTRAVENOUS | 0 refills | Status: AC
Start: 2022-08-29 — End: ?
  Administered 2022-08-30: 04:00:00 1 g via INTRAVENOUS

## 2022-08-29 MED ORDER — FLUTICASONE FUROATE 100 MCG/ACTUATION IN DSDV
1 | Freq: Every day | RESPIRATORY_TRACT | 0 refills | Status: AC
Start: 2022-08-29 — End: ?
  Administered 2022-09-01 – 2022-09-03 (×2): 1 via RESPIRATORY_TRACT

## 2022-08-29 MED ORDER — SPIRONOLACTONE 25 MG PO TAB
25 mg | Freq: Every day | ORAL | 0 refills | Status: AC
Start: 2022-08-29 — End: ?
  Administered 2022-08-30 (×2): 25 mg via ORAL

## 2022-08-29 MED ORDER — HYDROXYZINE HCL 25 MG PO TAB
25 mg | Freq: Two times a day (BID) | ORAL | 0 refills | Status: AC
Start: 2022-08-29 — End: ?
  Administered 2022-08-30 – 2022-09-04 (×12): 25 mg via ORAL

## 2022-08-29 MED ORDER — BUMETANIDE 0.25 MG/ML IJ SOLN
2 mg | Freq: Once | INTRAVENOUS | 0 refills | Status: CP
Start: 2022-08-29 — End: ?
  Administered 2022-08-30: 03:00:00 2 mg via INTRAVENOUS

## 2022-08-29 MED ORDER — HEPARIN (PORCINE) IN 5 % DEX 20,000 UNIT/500 ML (40 UNIT/ML) IV SOLP
0-2000 [IU]/h | INTRAVENOUS | 0 refills | Status: DC
Start: 2022-08-29 — End: 2022-08-30
  Administered 2022-08-30: 03:00:00 925.5 [IU]/h via INTRAVENOUS

## 2022-08-30 ENCOUNTER — Encounter: Admit: 2022-08-30 | Discharge: 2022-08-29 | Payer: MEDICARE

## 2022-08-30 ENCOUNTER — Inpatient Hospital Stay: Admit: 2022-08-30 | Discharge: 2022-08-29 | Payer: MEDICARE

## 2022-08-30 ENCOUNTER — Encounter: Admit: 2022-08-30 | Discharge: 2022-08-30 | Payer: MEDICARE

## 2022-08-30 MED ADMIN — BUMETANIDE 0.25 MG/ML IJ SOLN [9308]: 4 mg | INTRAVENOUS | @ 08:00:00 | Stop: 2022-08-30 | NDC 00641600701

## 2022-08-30 MED ADMIN — BUMETANIDE 0.25 MG/ML IJ SOLN [9308]: 4 mg | INTRAVENOUS | @ 14:00:00 | Stop: 2022-08-30 | NDC 00641600701

## 2022-08-30 NOTE — Progress Notes
Vascular Access Team consulted to obtain lab specimen.    Ultrasound Used: Yes    How Many Attempts: 1        At 1830and 1845 approximately 55 ml of blood obtained from R anterior FA and R posterior FA.Patient tolerated the procedure well. Specimen given to primary RN

## 2022-08-31 ENCOUNTER — Inpatient Hospital Stay: Admit: 2022-08-31 | Discharge: 2022-08-31 | Payer: MEDICARE

## 2022-08-31 MED ADMIN — PREGABALIN 50 MG PO CAP [94903]: 50 mg | ORAL | @ 14:00:00 | NDC 60687048411

## 2022-08-31 MED ADMIN — BUMETANIDE 0.25 MG/ML IJ SOLN [9308]: 4 mg | INTRAVENOUS | @ 04:00:00 | Stop: 2022-08-31 | NDC 00641600701

## 2022-08-31 MED ADMIN — PREGABALIN 50 MG PO CAP [94903]: 50 mg | ORAL | @ 02:00:00 | NDC 60687048411

## 2022-08-31 MED ADMIN — POTASSIUM CHLORIDE 20 MEQ PO TBTQ [35943]: 20 meq | ORAL | @ 15:00:00 | Stop: 2022-08-31 | NDC 00832532511

## 2022-08-31 MED ADMIN — PERFLUTREN LIPID MICROSPHERES 1.1 MG/ML IV SUSP [79178]: 4 mL | INTRAVENOUS | @ 14:00:00 | Stop: 2022-08-31 | NDC 11994001116

## 2022-08-31 MED ADMIN — PREGABALIN 50 MG PO CAP [94903]: 50 mg | ORAL | @ 21:00:00 | NDC 60687048411

## 2022-08-31 MED ADMIN — ALLOPURINOL 100 MG PO TAB [310]: 50 mg | ORAL | @ 14:00:00 | NDC 00904704161

## 2022-08-31 MED ADMIN — BUMETANIDE 0.25 MG/ML IJ SOLN [9308]: 4 mg | INTRAVENOUS | @ 19:00:00 | Stop: 2022-08-31 | NDC 65219057201

## 2022-09-01 ENCOUNTER — Inpatient Hospital Stay: Admit: 2022-09-01 | Discharge: 2022-09-01 | Payer: MEDICARE

## 2022-09-01 MED ADMIN — ENOXAPARIN 30 MG/0.3 ML SC SYRG [85048]: 30 mg | SUBCUTANEOUS | @ 02:00:00 | NDC 00781323801

## 2022-09-01 MED ADMIN — MAGNESIUM SULFATE IN WATER 4 GRAM/50 ML (8 %) IV PGBK [166563]: 4 g | INTRAVENOUS | @ 19:00:00 | Stop: 2022-09-02 | NDC 00264420552

## 2022-09-01 MED ADMIN — CHLOROTHIAZIDE SODIUM 500 MG IV SOLR [81318]: 250 mg | INTRAVENOUS | @ 19:00:00 | Stop: 2022-09-01 | NDC 63323065820

## 2022-09-01 MED ADMIN — SPIRONOLACTONE 25 MG PO TAB [7437]: 12.5 mg | ORAL | @ 17:00:00 | NDC 00904692761

## 2022-09-01 MED ADMIN — POTASSIUM CHLORIDE 20 MEQ PO TBTQ [35943]: 40 meq | ORAL | @ 01:00:00 | Stop: 2022-09-01 | NDC 00832532511

## 2022-09-01 MED ADMIN — WATER FOR INJECTION, STERILE IJ SOLN [79513]: 20 mL | INTRAVENOUS | @ 19:00:00 | Stop: 2022-09-01 | NDC 00409488723

## 2022-09-01 MED ADMIN — BUMETANIDE 0.25 MG/ML IJ SOLN [9308]: 4 mg | INTRAVENOUS | NDC 72205010101

## 2022-09-01 MED ADMIN — DICLOFENAC SODIUM 1 % TP GEL [168032]: 2 g | TOPICAL | @ 01:00:00 | NDC 00067815203

## 2022-09-01 MED ADMIN — PREGABALIN 50 MG PO CAP [94903]: 50 mg | ORAL | @ 15:00:00 | NDC 60687048411

## 2022-09-01 MED ADMIN — MAGNESIUM SULFATE IN D5W 1 GRAM/100 ML IV PGBK [166578]: 1 g | INTRAVENOUS | @ 15:00:00 | Stop: 2022-09-01 | NDC 44567041024

## 2022-09-01 MED ADMIN — IRON SUCROSE 100 MG IRON/5 ML IV SOLN GROUP [280019]: 300 mg | INTRAVENOUS | @ 02:00:00 | Stop: 2022-09-03 | NDC 00517234001

## 2022-09-01 MED ADMIN — BUMETANIDE 0.25 MG/ML IJ SOLN [9308]: 4 mg | INTRAVENOUS | @ 15:00:00 | NDC 65219057201

## 2022-09-01 MED ADMIN — PREGABALIN 50 MG PO CAP [94903]: 50 mg | ORAL | @ 22:00:00 | NDC 60687048411

## 2022-09-01 MED ADMIN — PREGABALIN 50 MG PO CAP [94903]: 50 mg | ORAL | @ 02:00:00 | NDC 60687048411

## 2022-09-01 MED ADMIN — SODIUM CHLORIDE 0.9 % IV SOLP [27838]: 300 mg | INTRAVENOUS | @ 15:00:00 | Stop: 2022-09-03 | NDC 00338004902

## 2022-09-01 MED ADMIN — BUMETANIDE 0.25 MG/ML IJ SOLN [9308]: 4 mg | INTRAVENOUS | @ 01:00:00 | NDC 00641600701

## 2022-09-01 MED ADMIN — ALLOPURINOL 100 MG PO TAB [310]: 50 mg | ORAL | @ 15:00:00 | NDC 00904704161

## 2022-09-01 MED ADMIN — IRON SUCROSE 100 MG IRON/5 ML IV SOLN GROUP [280019]: 300 mg | INTRAVENOUS | @ 15:00:00 | Stop: 2022-09-03 | NDC 00517234001

## 2022-09-01 MED ADMIN — SODIUM CHLORIDE 0.9 % IV SOLP [27838]: 300 mg | INTRAVENOUS | @ 02:00:00 | Stop: 2022-09-03 | NDC 00338004902

## 2022-09-02 ENCOUNTER — Inpatient Hospital Stay: Admit: 2022-09-02 | Discharge: 2022-09-02 | Payer: MEDICARE

## 2022-09-02 ENCOUNTER — Inpatient Hospital Stay: Admit: 2022-09-02 | Discharge: 2022-09-01 | Payer: MEDICARE

## 2022-09-02 MED ADMIN — DAPAGLIFLOZIN PROPANEDIOL 10 MG PO TAB [320172]: 10 mg | ORAL | @ 17:00:00 | NDC 00310621030

## 2022-09-02 MED ADMIN — BUMETANIDE 0.25 MG/ML IJ SOLN [9308]: 4 mg | INTRAVENOUS | @ 23:00:00 | NDC 72205010101

## 2022-09-02 MED ADMIN — PREGABALIN 50 MG PO CAP [94903]: 50 mg | ORAL | @ 15:00:00 | NDC 00904699261

## 2022-09-02 MED ADMIN — POTASSIUM CHLORIDE 20 MEQ PO TBTQ [35943]: 40 meq | ORAL | @ 15:00:00 | Stop: 2022-09-02 | NDC 00832532511

## 2022-09-02 MED ADMIN — IRON SUCROSE 100 MG IRON/5 ML IV SOLN GROUP [280019]: 300 mg | INTRAVENOUS | @ 15:00:00 | Stop: 2022-09-02 | NDC 00517234001

## 2022-09-02 MED ADMIN — SODIUM CHLORIDE 0.9 % IV SOLP [27838]: 300 mg | INTRAVENOUS | @ 15:00:00 | Stop: 2022-09-02 | NDC 00338004902

## 2022-09-02 MED ADMIN — MAGNESIUM SULFATE IN D5W 1 GRAM/100 ML IV PGBK [166578]: 1 g | INTRAVENOUS | @ 23:00:00 | Stop: 2022-09-03 | NDC 44567041024

## 2022-09-02 MED ADMIN — SPIRONOLACTONE 25 MG PO TAB [7437]: 12.5 mg | ORAL | @ 15:00:00 | NDC 00904692761

## 2022-09-02 MED ADMIN — POTASSIUM CHLORIDE 20 MEQ PO TBTQ [35943]: 40 meq | ORAL | @ 17:00:00 | NDC 00832532511

## 2022-09-02 MED ADMIN — METOPROLOL SUCCINATE 25 MG PO TB24 [81866]: 12.5 mg | ORAL | @ 03:00:00 | NDC 00904632261

## 2022-09-02 MED ADMIN — MAGNESIUM SULFATE IN D5W 1 GRAM/100 ML IV PGBK [166578]: 1 g | INTRAVENOUS | @ 17:00:00 | Stop: 2022-09-02 | NDC 44567041024

## 2022-09-02 MED ADMIN — PREGABALIN 50 MG PO CAP [94903]: 50 mg | ORAL | @ 03:00:00 | NDC 00904699261

## 2022-09-02 MED ADMIN — ALLOPURINOL 100 MG PO TAB [310]: 50 mg | ORAL | @ 15:00:00 | NDC 00904704161

## 2022-09-02 MED ADMIN — PREGABALIN 50 MG PO CAP [94903]: 50 mg | ORAL | @ 22:00:00 | NDC 00904699261

## 2022-09-02 MED ADMIN — ENOXAPARIN 40 MG/0.4 ML SC SYRG [85052]: 40 mg | SUBCUTANEOUS | @ 03:00:00 | NDC 00781324602

## 2022-09-02 MED ADMIN — BUMETANIDE 0.25 MG/ML IJ SOLN [9308]: 4 mg | INTRAVENOUS | @ 15:00:00 | NDC 72205010101

## 2022-09-03 MED ADMIN — BUMETANIDE 0.5 MG PO TAB [9309]: 2 mg | ORAL | @ 17:00:00 | NDC 69238148901

## 2022-09-03 MED ADMIN — POTASSIUM CHLORIDE 20 MEQ PO TBTQ [35943]: 40 meq | ORAL | @ 15:00:00 | NDC 00832532511

## 2022-09-03 MED ADMIN — DAPAGLIFLOZIN PROPANEDIOL 10 MG PO TAB [320172]: 10 mg | ORAL | @ 15:00:00 | NDC 00310621030

## 2022-09-03 MED ADMIN — POTASSIUM CHLORIDE 20 MEQ PO TBTQ [35943]: 10 meq | ORAL | @ 17:00:00 | Stop: 2022-09-03 | NDC 00832532511

## 2022-09-03 MED ADMIN — METOPROLOL SUCCINATE 25 MG PO TB24 [81866]: 12.5 mg | ORAL | @ 03:00:00 | NDC 00904632261

## 2022-09-03 MED ADMIN — ALLOPURINOL 100 MG PO TAB [310]: 50 mg | ORAL | @ 15:00:00 | NDC 00904704161

## 2022-09-03 MED ADMIN — SPIRONOLACTONE 25 MG PO TAB [7437]: 12.5 mg | ORAL | @ 15:00:00 | NDC 00904692761

## 2022-09-03 MED ADMIN — PREGABALIN 50 MG PO CAP [94903]: 50 mg | ORAL | @ 15:00:00 | NDC 00904699261

## 2022-09-03 MED ADMIN — PREGABALIN 50 MG PO CAP [94903]: 50 mg | ORAL | @ 21:00:00 | NDC 00904699261

## 2022-09-03 MED ADMIN — PREGABALIN 50 MG PO CAP [94903]: 50 mg | ORAL | @ 03:00:00 | NDC 60687048411

## 2022-09-03 MED ADMIN — BUMETANIDE 0.5 MG PO TAB [9309]: 2 mg | ORAL | @ 23:00:00 | NDC 69238148901

## 2022-09-04 ENCOUNTER — Encounter: Admit: 2022-09-04 | Discharge: 2022-09-04 | Payer: MEDICARE

## 2022-09-04 MED ADMIN — PREGABALIN 50 MG PO CAP [94903]: 50 mg | ORAL | @ 15:00:00 | Stop: 2022-09-04 | NDC 00904699261

## 2022-09-04 MED ADMIN — BUMETANIDE 0.5 MG PO TAB [9309]: 2 mg | ORAL | @ 15:00:00 | Stop: 2022-09-04 | NDC 69238148901

## 2022-09-04 MED ADMIN — SPIRONOLACTONE 25 MG PO TAB [7437]: 12.5 mg | ORAL | @ 15:00:00 | Stop: 2022-09-04 | NDC 00904692761

## 2022-09-04 MED ADMIN — DAPAGLIFLOZIN PROPANEDIOL 10 MG PO TAB [320172]: 10 mg | ORAL | @ 15:00:00 | Stop: 2022-09-04 | NDC 00310621030

## 2022-09-04 MED ADMIN — PREGABALIN 50 MG PO CAP [94903]: 50 mg | ORAL | @ 03:00:00 | NDC 60687048411

## 2022-09-04 MED ADMIN — METOPROLOL SUCCINATE 25 MG PO TB24 [81866]: 12.5 mg | ORAL | @ 03:00:00 | NDC 00904632261

## 2022-09-04 MED ADMIN — ALLOPURINOL 100 MG PO TAB [310]: 50 mg | ORAL | @ 15:00:00 | Stop: 2022-09-04 | NDC 00904704161

## 2022-09-04 MED ADMIN — POTASSIUM CHLORIDE 20 MEQ PO TBTQ [35943]: 40 meq | ORAL | @ 15:00:00 | Stop: 2022-09-04 | NDC 00832532511

## 2022-09-04 MED ADMIN — PREGABALIN 50 MG PO CAP [94903]: 50 mg | ORAL | @ 20:00:00 | Stop: 2022-09-04 | NDC 00904699261

## 2022-09-04 MED FILL — METOPROLOL SUCCINATE 25 MG PO TB24: 25 mg | ORAL | 90 days supply | Qty: 45 | Fill #1 | Status: AC

## 2022-09-04 MED FILL — PREGABALIN 50 MG PO CAP: 50 mg | ORAL | 30 days supply | Qty: 90 | Fill #1 | Status: AC

## 2022-09-04 MED FILL — SPIRONOLACTONE 25 MG PO TAB: 25 mg | ORAL | 90 days supply | Qty: 45 | Fill #1 | Status: AC

## 2022-09-04 MED FILL — DAPAGLIFLOZIN PROPANEDIOL 10 MG PO TAB: 10 mg | ORAL | 90 days supply | Qty: 90 | Fill #1 | Status: AC

## 2022-09-07 ENCOUNTER — Encounter: Admit: 2022-09-07 | Discharge: 2022-09-07 | Payer: MEDICARE

## 2022-09-07 MED FILL — PREGABALIN 50 MG PO CAP: 50 mg | ORAL | 30 days supply | Qty: 90 | Fill #1 | Status: AC

## 2022-09-07 MED FILL — SPIRONOLACTONE 25 MG PO TAB: 25 mg | ORAL | 90 days supply | Qty: 45 | Fill #1 | Status: AC

## 2022-09-07 MED FILL — DAPAGLIFLOZIN PROPANEDIOL 10 MG PO TAB: 10 mg | ORAL | 90 days supply | Qty: 90 | Fill #1 | Status: AC

## 2022-09-07 MED FILL — METOPROLOL SUCCINATE 25 MG PO TB24: 25 mg | ORAL | 90 days supply | Qty: 45 | Fill #1 | Status: AC

## 2022-09-07 NOTE — Telephone Encounter
Patient Discharge Date from hospital: 09/04/22  Date Call Attempted: 09/07/22  Number of Attempts: 1  Date Call Completed:      Call placed to pt for 72 hour post hospital follow up call. LM on unauthorized VM requesting cb. MyChart message sent.     Two Patient Identifier complete: Yes []     Next Appointment    Next follow-up appointment on 09/07/22 at 1100 with 09/09/22, APRN was cancelled through [facility]    Transportation    Does pt have transportation?  Yes []     No []    NA []      Home Health    No     Medications    Does pt have all medications? Yes  []     No []       START taking:  dapagliflozin propanediol Herby Abraham)  Start taking on: September 05, 2022  CHANGE how you take:  albuterol-ipratropium (DUONEB)  bumetanide (BUMEX)  metoprolol succinate XL (TOPROL XL)  pregabalin (LYRICA)  spironolactone (ALDACTONE)    Diet    200 mg cholesterol, 2 G Na, 2 L fluid restriction     Is patient following prescribed diet and restrictions?  Yes []    No []      Scale/Weight    Does pt have a scale at home?  Yes []    No []     Did pt weight first thing this morning?  Yes []    No []      If yes, what was pt's first morning weight today?      Signs and Symptoms    Pt reports the following symptoms:           Pt verbalized understanding of signs and symptoms of HF and when to contact a provider or seek immediate assistance at the ER.    Was pt given zone sheet? Yes [x]   No []     Intervention(s)          Plan of Care    Continued education needed for heart failure symptom management and when to contact our office.

## 2022-09-07 NOTE — Telephone Encounter
Patient Discharge Date from hospital: 09/04/22  Date Call Attempted: 09/07/22  Number of Attempts: 2  Date Call Completed:  09/07/22       Two Patient Identifier complete: Yes [x]      Next Appointment     Next follow-up appointment on 09/07/22 at 1100 with Herby Abraham, APRN was cancelled through [facility]. Pt states she cannot drive that far.       Transportation     Does pt have transportation?  Yes []     No [x]    NA []                  Home Health     No      Medications     Does pt have all medications? Yes  []     No [x]      Pt and daughter did not pick up her [place]. They called the pharmacy and are having it shipped to her.      START taking:  dapagliflozin propanediol Marcelline Deist)  Start taking on: September 05, 2022  CHANGE how you take:  albuterol-ipratropium (DUONEB)  bumetanide (BUMEX)  metoprolol succinate XL (TOPROL XL)  pregabalin (LYRICA)  spironolactone (ALDACTONE)     Diet     200 mg cholesterol, 2 G Na, 2 L fluid restriction      Is patient following prescribed diet and restrictions?  Yes [x]    No []       Scale/Weight     Does pt have a scale at home?  Yes [x]    No []      Did pt weight first thing this morning?  Yes []    No [x]       If yes, what was pt's first morning weight today?  Education provided     Signs and Symptoms     Pt reports the following symptoms:       No BLE edema or upper abdominal bloating/chest tightness. States her SOA is doing good. She uses O2 at 3.5 L per NC continuously.          Pt verbalized understanding of signs and symptoms of HF and when to contact a provider or seek immediate assistance at the ER.     Was pt given zone sheet? Yes [x]   No []      Intervention(s)     Routing to Dr. Orvan July team to address follow up appointment.     Pt educated on the importance of weighing daily first thing in the morning before dressing, before eating or drinking, and after voiding using the same scale in the same location and write results down in note pad or log. Notify us for weight gains of 3 lbs in one day or 5 lbs in one week. Notify your provider for increased SOA.  Notify your provider for swelling or increased swelling in BLE or abdominal fullness/bloating. Advised to check B/P at least once daily. Check 1-2 hours after am meds. Log results. Check B/P other times if feeling lightheaded, dizzy, or if you feel your heart rate is elevated. Document the time you checked and any symptoms you may be feeling at the time. Call 911 for sudden, severe chest/pain pressure/SOA develops. Be sure to keep your follow up appointment and bring your weight logs, B/P logs, and medication list with you to your appointment. Call us at [phone] if you have any questions.          Plan of Care     Continued education needed for heart  failure symptom management and when to contact our office.

## 2022-09-08 ENCOUNTER — Encounter: Admit: 2022-09-08 | Discharge: 2022-09-08 | Payer: MEDICARE

## 2022-09-08 DIAGNOSIS — Z95810 Presence of automatic (implantable) cardiac defibrillator: Secondary | ICD-10-CM

## 2022-09-10 ENCOUNTER — Encounter: Admit: 2022-09-10 | Discharge: 2022-09-10 | Payer: MEDICARE

## 2022-09-11 ENCOUNTER — Encounter: Admit: 2022-09-11 | Discharge: 2022-09-11 | Payer: MEDICARE

## 2022-09-11 NOTE — Telephone Encounter
HEART LOGIC ALERT    Received Heart Logic Alert for patient. HL index is 32. HL will continue to alert weekly until HL index crosses alert recovery threshold of 6. HL trending down. Unable to reach patient. Multiple appointments canceled via mychart.         Previous Heart Logic Alerts  (Note: X indicates contributing trends)      Most Recent BMP    Basic Metabolic Profile    Lab Results   Component Value Date/Time    NA 136 (L) 09/04/2022 05:49 AM    K 4.5 09/04/2022 05:49 AM    CA 8.1 (L) 09/04/2022 05:49 AM    CL 98 09/04/2022 05:49 AM    CO2 30 09/04/2022 05:49 AM    GAP 8 09/04/2022 05:49 AM    Lab Results   Component Value Date/Time    BUN 25 09/04/2022 05:49 AM    CR 1.25 (H) 09/04/2022 05:49 AM    GLU 79 09/04/2022 05:49 AM    GLU 91 02/15/2003 05:16 AM        Medications  Reviewed the following medications with patient. Patient reports taking medications as prescribed and has not missed any doses.    Home Medications    Medication Sig   acetaminophen (TYLENOL) 325 mg tablet Take one tablet by mouth every 4 hours as needed for Pain.   albuterol (VENTOLIN HFA, PROAIR HFA) 90 mcg/actuation inhaler Inhale one puff to two puffs by mouth into the lungs as Needed for Wheezing.   albuterol-ipratropium (DUONEB) 0.5 mg-3 mg(2.5 mg base)/3 mL nebulizer solution Inhale 3 mL solution by nebulizer as directed every 4 hours as needed for Wheezing.   allopurinoL (ZYLOPRIM) 100 mg tablet Take one tablet by mouth daily. Take with food.   Aspirin 81 mg Tab Take one tablet by mouth daily.   atorvastatin (LIPITOR) 40 mg tablet Take one tablet by mouth daily.   bumetanide (BUMEX) 2 mg tablet Take one tablet by mouth twice daily.   clopiDOGrel (PLAVIX) 75 mg tablet Take one tablet by mouth daily.   colchicine (COLCRYS) 0.6 mg tablet Take one tablet by mouth twice daily.   dapagliflozin propanediol (FARXIGA) 10 mg tablet Take one tablet by mouth daily. Indications: heart failure with reduced ejection fraction   diclofenac sodium (VOLTAREN) 1 % topical gel Apply four g topically to affected area four times daily.   FLUoxetine (PROZAC) 10 mg capsule Take one capsule by mouth daily.   fluticasone-umeclidin-vilanter (TRELEGY ELLIPTA) 100-62.5-25 mcg inhaler Inhale one puff by mouth into the lungs daily.   hydrOXYzine HCL (ATARAX) 25 mg tablet Take one tablet by mouth twice daily.   metoprolol succinate XL (TOPROL XL) 25 mg extended release tablet Take one-half tablet by mouth at bedtime daily.   pantoprazole DR (PROTONIX) 40 mg tablet Take one tablet by mouth daily.   potassium chloride SR (K-DUR) 20 mEq tablet Take one tablet by mouth daily. Take with a meal and a full glass of water. **Take one tablet by mouth twice daily for three days, then resume previous dose.**   pregabalin (LYRICA) 50 mg capsule Take one capsule by mouth three times daily.   spironolactone (ALDACTONE) 25 mg tablet Take one-half tablet by mouth daily. Take with food.       Upcoming Appointments:    No future appointments.

## 2022-09-11 NOTE — Telephone Encounter
Recent hospitalization at [facility].  Dtr called requesting follow up with [facility] in [place].  Overbooked.

## 2022-09-11 NOTE — Telephone Encounter
-----   Message from Ninfa Meeker sent at 09/11/2022  8:55 AM CST -----  Regarding: SDO elevated HL index and A-tach episodes  Remote alert received for HeartLogic Heart Failure Index remains above the alert recovery threshold of 6. The current index measurement is 32. All trends/factors are contributing/worsening factors with exception of thoracic impedance. This will continue to alert weekly until trends return to below the recovery threshold of 6.  Patient is also having brief A-tach episodes.  Complete report sent to chart for further detail/review.  Follow up as needed.      Thank you,  Terri/Device Team

## 2022-09-16 ENCOUNTER — Encounter: Admit: 2022-09-16 | Discharge: 2022-09-16 | Payer: MEDICARE

## 2022-09-18 ENCOUNTER — Encounter: Admit: 2022-09-18 | Discharge: 2022-09-18 | Payer: MEDICARE

## 2022-09-18 DIAGNOSIS — Z95810 Presence of automatic (implantable) cardiac defibrillator: Secondary | ICD-10-CM

## 2022-09-18 NOTE — Telephone Encounter
HEART LOGIC ALERT    Received Heart Logic Alert for patient. HL index is 19, down from 36 last week. This is week 3 in alert status. HL will continue to alert weekly until HL index crosses alert recovery threshold of 6.     Per Heart Logic Protocol, will continue to monitor weekly as HL Index is trending down to thresholds at this time. Routed to Cardiologist to review per workflow. Pt has OV with Dr. Barry Dienes 09/22/22.  Please note remote findings show pt has has 133.45 PVC's/hour.        Graph of Recent Heart Logic Alerts        Most Recent BMP    Basic Metabolic Profile    Lab Results   Component Value Date/Time    NA 136 (L) 09/04/2022 05:49 AM    K 4.5 09/04/2022 05:49 AM    CA 8.1 (L) 09/04/2022 05:49 AM    CL 98 09/04/2022 05:49 AM    CO2 30 09/04/2022 05:49 AM    GAP 8 09/04/2022 05:49 AM    Lab Results   Component Value Date/Time    BUN 25 09/04/2022 05:49 AM    CR 1.25 (H) 09/04/2022 05:49 AM    GLU 79 09/04/2022 05:49 AM    GLU 91 02/15/2003 05:16 AM        Medications    Home Medications    Medication Sig   acetaminophen (TYLENOL) 325 mg tablet Take one tablet by mouth every 4 hours as needed for Pain.   albuterol (VENTOLIN HFA, PROAIR HFA) 90 mcg/actuation inhaler Inhale one puff to two puffs by mouth into the lungs as Needed for Wheezing.   albuterol-ipratropium (DUONEB) 0.5 mg-3 mg(2.5 mg base)/3 mL nebulizer solution Inhale 3 mL solution by nebulizer as directed every 4 hours as needed for Wheezing.   allopurinoL (ZYLOPRIM) 100 mg tablet Take one tablet by mouth daily. Take with food.   Aspirin 81 mg Tab Take one tablet by mouth daily.   atorvastatin (LIPITOR) 40 mg tablet Take one tablet by mouth daily.   bumetanide (BUMEX) 2 mg tablet Take one tablet by mouth twice daily.   clopiDOGrel (PLAVIX) 75 mg tablet Take one tablet by mouth daily.   colchicine (COLCRYS) 0.6 mg tablet Take one tablet by mouth twice daily.   dapagliflozin propanediol (FARXIGA) 10 mg tablet Take one tablet by mouth daily. Indications: heart failure with reduced ejection fraction   diclofenac sodium (VOLTAREN) 1 % topical gel Apply four g topically to affected area four times daily.   FLUoxetine (PROZAC) 10 mg capsule Take one capsule by mouth daily.   fluticasone-umeclidin-vilanter (TRELEGY ELLIPTA) 100-62.5-25 mcg inhaler Inhale one puff by mouth into the lungs daily.   hydrOXYzine HCL (ATARAX) 25 mg tablet Take one tablet by mouth twice daily.   metoprolol succinate XL (TOPROL XL) 25 mg extended release tablet Take one-half tablet by mouth at bedtime daily.   pantoprazole DR (PROTONIX) 40 mg tablet Take one tablet by mouth daily.   potassium chloride SR (K-DUR) 20 mEq tablet Take one tablet by mouth daily. Take with a meal and a full glass of water. **Take one tablet by mouth twice daily for three days, then resume previous dose.**   pregabalin (LYRICA) 50 mg capsule Take one capsule by mouth three times daily.   spironolactone (ALDACTONE) 25 mg tablet Take one-half tablet by mouth daily. Take with food.       Upcoming Appointments:    Future Appointments   Date  Time Provider Department Center   09/22/2022 11:15 AM Vanice Sarah, MD [facility] CVM Exam   09/29/2022  3:00 PM Julienne Kass, MD [facility] CVM Exam   10/20/2022 10:45 AM [facility] CVM Procedur

## 2022-09-18 NOTE — Progress Notes
Cardiac Navigation Intake Assessment Document      LAAO Operator:  Dr. Riley Nearing    Patient Name:  Brittany Mccormick  MRN:  [id number]  DOB:  1955-10-28  Insurance:   Payor: [employer] MEDICARE / Plan: [employer] COMMUNITY PLAN SNP - [place] / Product Type: Medicare /   Primary contact for patient: self    Appointment Info:   Future Appointments   Date Time Provider Department Center   09/22/2022 11:15 AM Vanice Sarah, MD [facility] CVM Exam   09/29/2022  3:00 PM Julienne Kass, MD [facility] CVM Exam       Diagnosis and Reason for Visit:  paroxysmal Afib/Watchman  GI Bleed    Physician Info:   Referring Provider:   Seth Bake APRN  Shared Decision Making plan:  Patient seeing Dr. Barry Dienes on 09/23/2022. Requested he discuss and document SDM.  Cardiologist:   Dr. Barry Dienes  EP:   n/a  PCP:   [facility] [facility]  GI:   n/a  Neuro:   n/a  Other:   n/a    LAAO Indication:   History of Bleeding or increased risk of bleeding    CHA2DS2VASc Score:     CHF (1), 65-74 (1), Female (1), and Prior MI, PAD, or Aortic Plaque (1)  CHA2DS2VASc Yearly Stroke Risk :    4=4%    HAS-BLED Score:    Abnormal Renal Function (1), Bleeding History (1), Elderly >65 (1), and Current Antiplatelet or NSAID use (1)  HAS-BLED Yearly Risk:    4=8.7%    LAAO Exclusion Criteria:   n/a    Current Anticoagulation:   None    Current Antiplatelet:   Plavix (Clopidogrel) and ASA Dose 81mg     Able to take Aspirin:  YES  Able to take Plavix:  YES  Able to take short term Warfarin:  NO    History of Present Illness:      Atrial Fibrillation Classification: Paroxysmal      AF Treatment Medication Mgmt         Device ICD                NYHA Class:   III    Obstructive Sleep Apnea:  NO  Treatment:   n/a        Medical history (pertinent to LAAO workup) :  Paroxysmal Afib, HFrEF, CAD, HLD, CKD 3, PVD, COPD (oxygen 3 L NC continuous), NSTEMI, Cardiac Arrest, GI Bleed      Surgery/Procedure history (pertinent to LAAO workup):  CABG 2003, ICD, PCI      Last Echo: 08/31/2022  Interpretation Summary    The left ventricle is severely dilated, severely reduced function, ejection fraction 27%, global hypokinesis with regional variation.  Moderate diastolic dysfunction.  The right ventricle is mildly dilated, mildly reduced function. Pacemaker or ICD lead is noted  in right atrium and right ventricle  Mild biatrial enlargement  Moderate mitral valve regurgitation.  Severe tricuspid valve regurgitation (hepatic vein flow reversal noted)  Trivial pericardial effusion, left pleural effusion.  Pulmonary hypertension, estimated PA systolic pressure 60 mmHg, mildly elevated central venous pressure.     Comparison is made to prior study of 08/28/2019, EF previously 35%.  Tricuspid and mitral regurgitation previously mild.  PA systolic pressure previously 32 mmHg.  Mildly dilated left ventricle on previous study, now severely dilated.     Last TEE:  none       Last CTA Chest:  07/27/2019 [name] - images requested  Impression  Chest:  1. No pulmonary embolism.    2. Bilateral ground glass opacities and smooth septal line thickening  compatible with moderate pulmonary edema. Associated small bilateral  pleural effusions.    3. Nodular airspace opacities and bilateral lower lobe dependent  predominant  consolidation which may represent multifocal pneumonia or  aspiration. Short interval follow-up chest radiograph is recommended in  6-8 weeks to document resolution.    Abdomen/pelvis:  1. Hyperenhancing adrenal glands which can be seen in the setting of  hypoperfusion.         NEEDS Assessment:                             Social Work/Financial:  No need identified                      Physical:  Oxygen                                  Communication:  No needs identified      Plan:  Patient is scheduled for telehealth visit to discuss Watchman with Dr. Riley Nearing since she relies on her daughter for transportation and there were no other options with her work scheduled.    Comments: Patient updated on current plan. Determined the best date for an appointment. Educated to all appointment information.

## 2022-09-21 ENCOUNTER — Encounter: Admit: 2022-09-21 | Discharge: 2022-09-21 | Payer: MEDICARE

## 2022-09-22 ENCOUNTER — Encounter: Admit: 2022-09-22 | Discharge: 2022-09-22 | Payer: MEDICARE

## 2022-09-22 DIAGNOSIS — K922 Gastrointestinal hemorrhage, unspecified: Secondary | ICD-10-CM

## 2022-09-22 DIAGNOSIS — I4811 Longstanding persistent atrial fibrillation: Secondary | ICD-10-CM

## 2022-09-22 DIAGNOSIS — J95821 Acute postprocedural respiratory failure: Secondary | ICD-10-CM

## 2022-09-22 DIAGNOSIS — Z87891 Personal history of nicotine dependence: Secondary | ICD-10-CM

## 2022-09-22 DIAGNOSIS — I255 Ischemic cardiomyopathy: Secondary | ICD-10-CM

## 2022-09-22 DIAGNOSIS — D649 Anemia, unspecified: Secondary | ICD-10-CM

## 2022-09-22 DIAGNOSIS — I2510 Atherosclerotic heart disease of native coronary artery without angina pectoris: Secondary | ICD-10-CM

## 2022-09-22 DIAGNOSIS — I469 Cardiac arrest, cause unspecified: Secondary | ICD-10-CM

## 2022-09-22 DIAGNOSIS — I25118 Atherosclerotic heart disease of native coronary artery with other forms of angina pectoris: Secondary | ICD-10-CM

## 2022-09-22 DIAGNOSIS — M109 Gout, unspecified: Secondary | ICD-10-CM

## 2022-09-22 DIAGNOSIS — I480 Paroxysmal atrial fibrillation: Secondary | ICD-10-CM

## 2022-09-22 DIAGNOSIS — E7800 Pure hypercholesterolemia, unspecified: Secondary | ICD-10-CM

## 2022-09-22 DIAGNOSIS — I5023 Acute on chronic systolic (congestive) heart failure: Secondary | ICD-10-CM

## 2022-09-22 DIAGNOSIS — I219 Acute myocardial infarction, unspecified: Secondary | ICD-10-CM

## 2022-09-22 DIAGNOSIS — I739 Peripheral vascular disease, unspecified: Secondary | ICD-10-CM

## 2022-09-22 DIAGNOSIS — J9601 Acute respiratory failure with hypoxia: Secondary | ICD-10-CM

## 2022-09-22 DIAGNOSIS — I5043 Acute on chronic combined systolic (congestive) and diastolic (congestive) heart failure: Secondary | ICD-10-CM

## 2022-09-22 DIAGNOSIS — Z9981 Dependence on supplemental oxygen: Secondary | ICD-10-CM

## 2022-09-22 DIAGNOSIS — M1990 Unspecified osteoarthritis, unspecified site: Secondary | ICD-10-CM

## 2022-09-22 DIAGNOSIS — E785 Hyperlipidemia, unspecified: Secondary | ICD-10-CM

## 2022-09-22 DIAGNOSIS — I724 Aneurysm of artery of lower extremity: Secondary | ICD-10-CM

## 2022-09-22 DIAGNOSIS — R200 Anesthesia of skin: Secondary | ICD-10-CM

## 2022-09-22 DIAGNOSIS — Z95810 Presence of automatic (implantable) cardiac defibrillator: Secondary | ICD-10-CM

## 2022-09-22 DIAGNOSIS — Z8679 Personal history of other diseases of the circulatory system: Secondary | ICD-10-CM

## 2022-09-22 MED ORDER — SPIRONOLACTONE 25 MG PO TAB
25 mg | ORAL_TABLET | Freq: Every day | ORAL | 0 refills | 90.00000 days | Status: AC
Start: 2022-09-22 — End: ?

## 2022-09-22 MED ORDER — PREGABALIN 50 MG PO CAP
50 mg | ORAL_CAPSULE | Freq: Two times a day (BID) | ORAL | 0 refills | Status: AC
Start: 2022-09-22 — End: ?

## 2022-09-22 MED ORDER — METOPROLOL SUCCINATE 25 MG PO TB24
25 mg | ORAL_TABLET | Freq: Every evening | ORAL | 0 refills | 90.00000 days | Status: AC
Start: 2022-09-22 — End: ?

## 2022-09-22 MED ORDER — BUMETANIDE 2 MG PO TAB
2 mg | ORAL_TABLET | Freq: Every day | ORAL | 0 refills | Status: AC
Start: 2022-09-22 — End: ?

## 2022-09-22 NOTE — Assessment & Plan Note
Her most recent device check was on December 29.  Her device is functioning normally and she has had no antitacky therapies recently.

## 2022-09-22 NOTE — Assessment & Plan Note
She has an upcoming appointment for consideration of a Watchman device.

## 2022-09-22 NOTE — Assessment & Plan Note
The last coronary arteriogram was nearly 4 years ago.  She is a very small person and revascularization options have been quite difficult in her situation.  It really seems most likely that medication noncompliance and fluid overload was responsible for the exacerbation of heart failure last month.  I did not initiate stress testing or other evaluations to assess for progression of coronary disease at this point.  I have the inclination that treating the patient's symptoms status is probably going to be more important than pursuing a lot of surveillance testing.

## 2022-09-22 NOTE — Assessment & Plan Note
The respiratory failure due to acute heart failure in December appears to primarily be related to medication noncompliance.  She responded to diuresis and has been compliant since that hospitalization.

## 2022-09-22 NOTE — Assessment & Plan Note
Lab Results   Component Value Date    CHOL 98 12/02/2020    TRIG 64 12/02/2020    HDL 38 (L) 12/02/2020    LDL 47 12/02/2020    VLDL 13 12/02/2020    NONHDLCHOL 85 02/10/2014    CHOLHDLC 3 12/02/2020      LDL treated to goal.

## 2022-09-22 NOTE — Progress Notes
Date of Service: 09/22/2022    Brittany Mccormick is a 67 y.o. female.       HPI     Brittany Mccormick was in the [place] clinic today for post-hospitalization follow-up.  In mid-December she presented to the [facility] ED with acute respiratory failure and was transferred to the [facility] [facility]U where she was treated with  BIPAP, but didn't require mechanical ventilation.  She had reportedly stopped taking her medications a couple of weeks previously and the discharge summary seems to indicate that the primary problem with acute heart failure.  She was treated with IV diuretics and was discharged after about 8 days.    Since then she is using oxygen continuously and seems to be complying with her medication program.  She says that she weighs about 25 pounds less than she did just prior to the hospitalization.    She has a history of PAF and is considered a poor candidate for oral anti-coagulation due to a history of recurrent GI bleeding.  She has an appointment in one week to initiate the evaluation for a Watchman device.    She denies anginal discomfort currently.  She had an echocardiogram in December showing EF in the 25-30% range with severe TR.  Her PAP was in the 60's.  Her serum troponin was in the 300-400's.  I don't see that she's had an evaluation recently for progression of her CAD.    She denies palpitations and converted spontaneously back to sinus rhythm prior to hospital discharge last month.  She had a device check on 12/29 showing AF burden < 1%, but these episodes were apparently quite brief (4 seconds was the longest) making me wonder if she's really had AF post-hospitalization.         Vitals:    09/22/22 1112   BP: 108/62   BP Source: Arm, Left Upper   SpO2: 98%   O2 Device: Nasal cannula   O2 Liter Flow: 3 Lpm   PainSc: Zero   Weight: 59.9 kg (132 lb)   Height: 142.2 cm (4' 8)     Body mass index is 29.59 kg/m?Brittany Mccormick     Past Medical History  Patient Active Problem List    Diagnosis Date Noted    Shortness of breath 08/29/2022    Paroxysmal atrial fibrillation (HCC) 07/21/2022    Anemia 07/20/2022    GI bleed 07/20/2022    Leukocytosis 10/24/2018    NSVT (nonsustained ventricular tachycardia) (HCC) 10/24/2018    AKI (acute kidney injury) (HCC) 10/24/2018    Acute respiratory failure with hypoxia (HCC) 10/22/2018    Acute on chronic systolic heart failure (HCC) 04/07/2018    COPD (chronic obstructive pulmonary disease) (HCC) 04/07/2018    Anxiety 04/07/2018    ICD (implantable cardioverter-defibrillator), dual, 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  Mild to moderate mitral valve regurgitation. Moderate pulmonary HTN, PAP-51      History of cardiac  arrest with ventricular fibrillation (HCC) 04/01/2018    Pulmonary edema 03/31/2018    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       12/03 - CABG x 4 ([facility]): LIMA to LAD, SVG to distal RCA, SSVG-OM1, OM2.      Coronary artery disease of native artery of native heart with stable angina pectoris (HCC) 04/24/2009       5/01 - Non-Q MI-transferred to [facility]        5/01 - Cardiac cath: 40% distal LM, 80% narrowing of small DX, 50-60% proximal                   LAD, LCX 30-50% in OM1, 70% in OM2 distal LCX 50-60%, RCA had                   high-grade stenosis with appearance of ruptured plaque in proximal RCA        5/01 - PTCA/stent to RCA.        1/03 - PTCA/stent to LAD [facility]): 2.5x12 SciMed stent; 30% LM at ostium and 30-40%                   at distal portion of LM; 1LAD proximal 80-90%, status post PCI and stent;                   three tiny DXs without any lesions; LCX 30% at prox portion with diffuse, mild, luminal irregularities throughout 3 OMs; RCA 30-40% in distal portion with luminal                  irregularities, LVEF 65% with normal LV wall motion.        5/03 - PCI/brachytherapy to LAD; EF 40%, distal lt main 40% (at bifurcation                   to LAD and LCX), prox 1st Dx 20%, prox posterolateral branch 30%,                   prox LAD 90% (in-stent), mid LAD 30%, RCA stent patent with 30%                   irregularities distally        12/03 - Admit [place] for cath d/t recurrent chest pressure; cath showed severe 3-                   vessel disease.  Surgical consult.        12/03 - CABG x 4 ([facility]): LIMA to LAD, SVG to distal RCA, SSVG-OM1, OM2.         07/19 - PCI to mid circumflex    10/27/18: Cath after small NSTEMI:  Severe 3-vessel coronary artery disease as described above.  Patent left main and proximal left circumflex stent as well as mid left circumflex stent.  Unchanged from intervention in July 2013.  Patent saphenous vein graft to distal right coronary artery.  Patent left internal mammary artery to left anterior descending artery.  100% saphenous vein graft occlusion to an obtuse marginal branch (previous angiography in July 2019 revealed severe diffuse disease with poor flow and subtending a small territory).  This may be the culprit for the patient's small non-ST-elevation myocardial infarction, but it is not amenable to revascularization.      Hyperlipidemia 04/24/2009      5/01 - FLP: Total 168, Trigs 125, HDL 43, LDL 100.  02/01/00 - Zocor started.         9/01 - Total 115, Trigs 56, HDL 45, LDL 59.  Initiated Lipitor 20mg .         10/01 - Discontinue Lipitor and recheck lab values in 3-4 months.      Peripheral vascular disease (HCC) 04/24/2009     Inability to advance up right femoral artery, hence procedure done on left side.        3/03 - Distal left common iliac 50-70%, mid right SFA 50%, Lt 70-80% stenosis of                    external iliac, mild disease in SFA with an occlusion of posterior tibial;                   PTA of right common iliac using a 6.0 x 29 Genesis stent, left common iliac                   PTA with a 5.0 x 28 CIQ stent.         5/04 - Abdominal aortic angiography with bilateral ileo-femoral runoff.  PTA/stent to                   left external iliac artery (re-cannulization of total occlusion; 6.0- x 100-mm                   Smart stent device deployed and post dilated in its mid and distal portions with                   5.0-mm balloon; post dilated proximally with a 6.0-mm balloon).           6/04 - LE venous duplex for limb swelling/numbness: Mild common femoral vein reflux.                    Otherwise unremarkable study.  No evidence for CVT.          10/07 - Attempted PTA for left iliac artery occlusion.  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 Converted with IV amiodarone      Tobacco abuse 04/24/2009         Review of Systems   Constitutional: Negative.   HENT: Negative.     Eyes: Negative.    Cardiovascular: Negative.    Respiratory: Negative.     Endocrine: Negative.    Hematologic/Lymphatic: Negative.    Skin: Negative.    Musculoskeletal: Negative.    Gastrointestinal: Negative.    Genitourinary: Negative.    Neurological: Negative.    Psychiatric/Behavioral: Negative.     Allergic/Immunologic: Negative.        Physical Exam    Physical Exam   General Appearance: no distress, using supplemental oxygen via nasal cannula   Skin: warm, no ulcers or xanthomas   Digits and Nails: no cyanosis or clubbing   Eyes: conjunctivae and lids normal, pupils are equal and round   Teeth/Gums/Palate: dentition unremarkable, no lesions   Lips & Oral Mucosa: no pallor or cyanosis   Neck Veins: normal JVP , neck veins are not distended   Thyroid: no nodules, masses, tenderness or enlargement   Chest Inspection: chest is normal in appearance Respiratory Effort: breathing comfortably,  no respiratory distress   Auscultation/Percussion: lungs clear to auscultation, no rales or rhonchi, no wheezing   PMI: PMI not enlarged or displaced   Cardiac Rhythm: regular rhythm and normal rate   Cardiac Auscultation: S1, S2 normal, no rub, no gallop   Murmurs: no murmur   Peripheral Circulation: normal peripheral circulation   Carotid Arteries: normal carotid upstroke bilaterally, no bruits   Radial Arteries: normal symmetric radial pulses   Abdominal Aorta: no abdominal aortic bruit   Pedal Pulses: normal symmetric pedal pulses   Lower Extremity Edema: no lower extremity edema   Abdominal Exam: soft, non-tender, no masses, bowel sounds normal   Liver & Spleen: no organomegaly   Gait & Station: walks without assistance   Muscle Strength: normal muscle tone   Orientation: oriented to time, place and person   Affect & Mood: appropriate and sustained affect   Language and Memory: patient responsive and seems to comprehend information   Neurologic Exam: neurological assessment grossly intact   Other: moves all extremities      Cardiovascular Health Factors  Vitals BP Readings from Last 3 Encounters:   09/22/22 108/62   09/04/22 103/49   07/21/22 126/60     Wt Readings from Last 3 Encounters:   09/22/22 59.9 kg (132 lb)   09/03/22 62.1 kg (137 lb)   07/21/22 64.6 kg (142 lb 6.4 oz)     BMI Readings from Last 3 Encounters:   09/22/22 29.59 kg/m?   09/03/22 30.71 kg/m?   07/21/22 31.93 kg/m?      Smoking Social History     Tobacco Use   Smoking Status Former    Packs/day: 0    Types: Cigarettes    Quit date: 02/28/2018    Years since quitting: 4.5   Smokeless Tobacco Never      Lipid Profile Cholesterol   Date Value Ref Range Status   12/02/2020 98  Final     HDL   Date Value Ref Range Status   12/02/2020 38 (L) >=40 Final     LDL   Date Value Ref Range Status   12/02/2020 47  Final     Triglycerides   Date Value Ref Range Status   12/02/2020 64  Final      Blood Sugar Hemoglobin A1C   Date Value Ref Range Status   02/10/2014 5.3 4.0 - 6.0 % Final     Comment:     The ADA recommends that most patients with type 1 and type 2 diabetes maintain   an A1c level <7%.       Glucose   Date Value Ref Range Status   09/04/2022 79 70 - 100 MG/DL Final   11/91/4782 956 (H) 70 - 100 MG/DL Final   21/30/8657 90 70 - 100 MG/DL Final   84/69/6295 91 70 - 110 MG/DL Final   28/41/3244 010 70 - 110 MG/DL Final   27/25/3664 403 70 - 110 MG/DL Final     Glucose, POC   Date Value Ref Range Status   10/27/2018 101 (H) 70 - 100 MG/DL Final   47/42/5956 387 (H) 70 - 110 MG/DL Final   56/43/3295 188 (H) 70 - 110 MG/DL Final          Problems Addressed Today  Encounter Diagnoses   Name Primary?    Acute on chronic systolic heart failure (HCC) Yes    Acute respiratory failure with hypoxia (HCC)     Acute on chronic combined systolic (congestive)  and diastolic (congestive) heart failure (HCC)     Longstanding persistent atrial fibrillation (HCC)     Coronary artery disease of native artery of native heart with stable angina pectoris (HCC)     Pure hypercholesterolemia     Paroxysmal atrial fibrillation (HCC)     ICD (implantable cardioverter-defibrillator), dual, in situ        Assessment and Plan       Coronary artery disease of native artery of native heart with stable angina pectoris (HCC)  The last coronary arteriogram was nearly 4 years ago.  She is a very small person and revascularization options have been quite difficult in her situation.  It really seems most likely that medication noncompliance and fluid overload was responsible for the exacerbation of heart failure last month.  I did not initiate stress testing or other evaluations to assess for progression of coronary disease at this point.  I have the inclination that treating the patient's symptoms status is probably going to be more important than pursuing a lot of surveillance testing.    Acute on chronic systolic heart failure (HCC)  The respiratory failure due to acute heart failure in December appears to primarily be related to medication noncompliance.  She responded to diuresis and has been compliant since that hospitalization.    Hyperlipidemia  Lab Results   Component Value Date    CHOL 98 12/02/2020    TRIG 64 12/02/2020    HDL 38 (L) 12/02/2020    LDL 47 12/02/2020    VLDL 13 12/02/2020    NONHDLCHOL 85 02/10/2014    CHOLHDLC 3 12/02/2020      LDL treated to goal.    Paroxysmal atrial fibrillation [facility])  She has an upcoming appointment for consideration of a Watchman device.    ICD (implantable cardioverter-defibrillator), dual, in situ  Her most recent device check was on December 29.  Her device is functioning normally and she has had no antitacky therapies recently.      Current Medications (including today's revisions)   acetaminophen (TYLENOL) 325 mg tablet Take one tablet by mouth every 4 hours as needed for Pain.    albuterol (VENTOLIN HFA, PROAIR HFA) 90 mcg/actuation inhaler Inhale one puff to two puffs by mouth into the lungs as Needed for Wheezing.    albuterol-ipratropium (DUONEB) 0.5 mg-3 mg(2.5 mg base)/3 mL nebulizer solution Inhale 3 mL solution by nebulizer as directed every 4 hours as needed for Wheezing.    allopurinoL (ZYLOPRIM) 100 mg tablet Take one tablet by mouth daily. Take with food.    Aspirin 81 mg Tab Take one tablet by mouth daily.    atorvastatin (LIPITOR) 40 mg tablet Take one tablet by mouth daily.    bumetanide (BUMEX) 2 mg tablet Take one tablet by mouth daily.    clopiDOGrel (PLAVIX) 75 mg tablet Take one tablet by mouth daily.    colchicine (COLCRYS) 0.6 mg tablet Take one tablet by mouth twice daily.    dapagliflozin propanediol (FARXIGA) 10 mg tablet Take one tablet by mouth daily. Indications: heart failure with reduced ejection fraction    diclofenac sodium (VOLTAREN) 1 % topical gel Apply four g topically to affected area four times daily.    FLUoxetine (PROZAC) 10 mg capsule Take one capsule by mouth daily.    fluticasone-umeclidin-vilanter (TRELEGY ELLIPTA) 100-62.5-25 mcg inhaler Inhale one puff by mouth into the lungs daily.    hydrOXYzine HCL (ATARAX) 25 mg tablet Take one tablet by mouth as Needed.  metoprolol succinate XL (TOPROL XL) 25 mg extended release tablet Take one tablet by mouth at bedtime daily.    pantoprazole DR (PROTONIX) 40 mg tablet Take one tablet by mouth twice daily.    potassium chloride SR (K-DUR) 20 mEq tablet Take one tablet by mouth daily. Take with a meal and a full glass of water. **Take one tablet by mouth twice daily for three days, then resume previous dose.**    pregabalin (LYRICA) 50 mg capsule Take one capsule by mouth twice daily.    spironolactone (ALDACTONE) 25 mg tablet Take one tablet by mouth daily. Take with food.     Total time spent on today's office visit was 45 minutes.  This includes face-to-face in person visit with patient as well as nonface-to-face time including review of the EMR, outside records, labs, radiologic studies, echocardiogram & other cardiovascular studies, formation of treatment plan, after visit summary, future disposition, and lastly on documentation.

## 2022-09-25 ENCOUNTER — Encounter: Admit: 2022-09-25 | Discharge: 2022-09-25 | Payer: MEDICARE

## 2022-09-25 NOTE — Telephone Encounter
-----   Message from Karie Chimera sent at 09/25/2022  6:07 AM CST -----  Regarding: HeartLogic Alert  Remote alert was received for elevated HeartLogic. HeartLogic Heart Failure Index remains above the alert recovery threshold of 6. HeartLogic HF Index crossed alert threshold with index of 16. This will alert weekly until the index is below recovery threshold. Currently the contributing factors are S3 and S3/S1 ratio. Please see full report and follow up with the patient as needed.    Thanks,  Caryl Pina

## 2022-09-25 NOTE — Telephone Encounter
HEART LOGIC ALERT    Received Heart Logic Alert for patient. HL index is 16. HL will continue to alert weekly until HL index crosses alert recovery threshold of 6.   OV with Dr. Barry Dienes 1/2, no medication changes, encourage compliance.      Previous Heart Logic Alerts  (Note: X indicates contributing trends)      Most Recent BMP    Basic Metabolic Profile    Lab Results   Component Value Date/Time    NA 136 (L) 09/04/2022 05:49 AM    K 4.5 09/04/2022 05:49 AM    CA 8.1 (L) 09/04/2022 05:49 AM    CL 98 09/04/2022 05:49 AM    CO2 30 09/04/2022 05:49 AM    GAP 8 09/04/2022 05:49 AM    Lab Results   Component Value Date/Time    BUN 25 09/04/2022 05:49 AM    CR 1.25 (H) 09/04/2022 05:49 AM    GLU 79 09/04/2022 05:49 AM    GLU 91 02/15/2003 05:16 AM        Medications  Reviewed the following medications with patient. Patient reports taking medications as prescribed and has not missed any doses.    Home Medications    Medication Sig   acetaminophen (TYLENOL) 325 mg tablet Take one tablet by mouth every 4 hours as needed for Pain.   albuterol (VENTOLIN HFA, PROAIR HFA) 90 mcg/actuation inhaler Inhale one puff to two puffs by mouth into the lungs as Needed for Wheezing.   albuterol-ipratropium (DUONEB) 0.5 mg-3 mg(2.5 mg base)/3 mL nebulizer solution Inhale 3 mL solution by nebulizer as directed every 4 hours as needed for Wheezing.   allopurinoL (ZYLOPRIM) 100 mg tablet Take one tablet by mouth daily. Take with food.   Aspirin 81 mg Tab Take one tablet by mouth daily.   atorvastatin (LIPITOR) 40 mg tablet Take one tablet by mouth daily.   bumetanide (BUMEX) 2 mg tablet Take one tablet by mouth daily.   clopiDOGrel (PLAVIX) 75 mg tablet Take one tablet by mouth daily.   colchicine (COLCRYS) 0.6 mg tablet Take one tablet by mouth twice daily.   dapagliflozin propanediol (FARXIGA) 10 mg tablet Take one tablet by mouth daily. Indications: heart failure with reduced ejection fraction   diclofenac sodium (VOLTAREN) 1 % topical gel Apply four g topically to affected area four times daily.   FLUoxetine (PROZAC) 10 mg capsule Take one capsule by mouth daily.   fluticasone-umeclidin-vilanter (TRELEGY ELLIPTA) 100-62.5-25 mcg inhaler Inhale one puff by mouth into the lungs daily.   hydrOXYzine HCL (ATARAX) 25 mg tablet Take one tablet by mouth as Needed.   metoprolol succinate XL (TOPROL XL) 25 mg extended release tablet Take one tablet by mouth at bedtime daily.   pantoprazole DR (PROTONIX) 40 mg tablet Take one tablet by mouth twice daily.   potassium chloride SR (K-DUR) 20 mEq tablet Take one tablet by mouth daily. Take with a meal and a full glass of water. **Take one tablet by mouth twice daily for three days, then resume previous dose.**   pregabalin (LYRICA) 50 mg capsule Take one capsule by mouth twice daily.   spironolactone (ALDACTONE) 25 mg tablet Take one tablet by mouth daily. Take with food.       Upcoming Appointments:    Future Appointments   Date Time Provider Department Center   09/29/2022  3:00 PM Julienne Kass, MD [facility] CVM Exam   10/20/2022 10:45 AM [facility] CVM Procedur

## 2022-09-28 ENCOUNTER — Encounter: Admit: 2022-09-28 | Discharge: 2022-09-28 | Payer: MEDICARE

## 2022-09-29 ENCOUNTER — Encounter: Admit: 2022-09-29 | Discharge: 2022-09-29 | Payer: MEDICARE

## 2022-09-29 ENCOUNTER — Ambulatory Visit: Admit: 2022-09-29 | Discharge: 2022-09-30 | Payer: MEDICARE

## 2022-09-29 DIAGNOSIS — Z8679 Personal history of other diseases of the circulatory system: Secondary | ICD-10-CM

## 2022-09-29 DIAGNOSIS — E785 Hyperlipidemia, unspecified: Secondary | ICD-10-CM

## 2022-09-29 DIAGNOSIS — J95821 Acute postprocedural respiratory failure: Secondary | ICD-10-CM

## 2022-09-29 DIAGNOSIS — Z87891 Personal history of nicotine dependence: Secondary | ICD-10-CM

## 2022-09-29 DIAGNOSIS — I25709 Atherosclerosis of coronary artery bypass graft(s), unspecified, with unspecified angina pectoris: Secondary | ICD-10-CM

## 2022-09-29 DIAGNOSIS — T50905S Adverse effect of unspecified drugs, medicaments and biological substances, sequela: Secondary | ICD-10-CM

## 2022-09-29 DIAGNOSIS — Z9189 Other specified personal risk factors, not elsewhere classified: Secondary | ICD-10-CM

## 2022-09-29 DIAGNOSIS — I724 Aneurysm of artery of lower extremity: Secondary | ICD-10-CM

## 2022-09-29 DIAGNOSIS — D649 Anemia, unspecified: Secondary | ICD-10-CM

## 2022-09-29 DIAGNOSIS — M1990 Unspecified osteoarthritis, unspecified site: Secondary | ICD-10-CM

## 2022-09-29 DIAGNOSIS — Z9981 Dependence on supplemental oxygen: Secondary | ICD-10-CM

## 2022-09-29 DIAGNOSIS — I5042 Chronic combined systolic (congestive) and diastolic (congestive) heart failure: Secondary | ICD-10-CM

## 2022-09-29 DIAGNOSIS — I2510 Atherosclerotic heart disease of native coronary artery without angina pectoris: Secondary | ICD-10-CM

## 2022-09-29 DIAGNOSIS — I219 Acute myocardial infarction, unspecified: Secondary | ICD-10-CM

## 2022-09-29 DIAGNOSIS — M109 Gout, unspecified: Secondary | ICD-10-CM

## 2022-09-29 DIAGNOSIS — I255 Ischemic cardiomyopathy: Secondary | ICD-10-CM

## 2022-09-29 DIAGNOSIS — I739 Peripheral vascular disease, unspecified: Secondary | ICD-10-CM

## 2022-09-29 DIAGNOSIS — R200 Anesthesia of skin: Secondary | ICD-10-CM

## 2022-09-29 DIAGNOSIS — I469 Cardiac arrest, cause unspecified: Secondary | ICD-10-CM

## 2022-09-29 DIAGNOSIS — K922 Gastrointestinal hemorrhage, unspecified: Secondary | ICD-10-CM

## 2022-09-29 DIAGNOSIS — Z8719 Personal history of other diseases of the digestive system: Secondary | ICD-10-CM

## 2022-09-29 NOTE — Progress Notes
Date of Service: 09/29/2022    Brittany Mccormick is a 67 y.o. female.       Chief complaint: Referral for transcatheter left atrial appendage occlusion with Watchman device  Paroxysmal nonvalvular atrial fibrillation with multiple bleeding episodes and history of blood transfusion  Referral from Dr. Edwinna Areola and Ms. Seth Bake, APRN-NP    Identity verified for a Zoom/virtual visit with date of birth, age and house address.    HPI     Brittany Mccormick is a very pleasant 67 year old woman from [place], [place].  She is a long-term patient of Dr. Edwinna Areola from our institution.  He has been referred to [place] by Ms. Seth Bake APRN-NP for transcatheter left atrial appendage occlusion with the Watchman device.  She has had atrial fibrillation for at least a few years and states she has had multiple cardioversions.  In fact she has also had a history of ventricular fibrillation/sudden cardiac arrest resulting in successful resuscitation and implantation of an AICD.  Her historical EF has been somewhere between 25-35% in the past and has a history of nonsustained ventricular tachycardia.  After the diagnosis of atrial fibrillation, she was initially placed on Eliquis full therapeutic dose following which she had a GI bleed.  She states that in September 2023, she had had a significant GI bleed resulting in blood transfusions.  Fortunately, her hemoglobin has held upright above 8 g/dL.  She was switched to his rivaroxaban which still resulted in significant anemia and multiple different side effects which the patient described to me some of which are related to rivaroxaban and some of which are clearly not.  Given these findings, she has been switched to aspirin plus Plavix which she is tolerating currently.  She has a history of previous bypass grafting with Dr. Farris Has.  She reported to me that she has at least 20 stents in her body most of which are in the periphery.  No TIA or stroke symptoms are noted.  She also has a history of chronic respiratory failure with significant pulmonary hypertension.  Given these findings of high bleeding risk and high stroke risk, she has been referred to [place] for transcatheter left atrial appendage occlusion.  She does not report any history of IVC filters or prior transseptal procedures or prosthetic valves or mechanical prosthesis.  No history of endocarditis.    Current CT of the chest was designed for right-sided evaluation and hence proper opacification of the appendage is not present.    Assessment    1.  Paroxysmal nonvalvular atrial fibrillation  2.  High bleeding risk, prior GI bleeds requiring blood transfusions  3.  High stroke risk  4.  Congestive heart failure chronic combined, NYHA class II, ACC AHA stage C  5.  AICD placement for secondary prevention  6.  Prior history of cardiac arrest  7.  Moderate pulmonary hypertension  8.  Severe tricuspid insufficiency  9.  Peripheral arterial disease with multiple endovascular interventions in the past  10.  Coronary artery disease with previous CABG and chronic stable angina    Recommendations    1.  Objectively, she has a very high risk of stroke based upon her CHA2DS2-VASc score.  She has had multiple bleeding episodes in the past requiring blood transfusions.  She has also had a significant allergy/adverse reaction to rivaroxaban as well.  This certainly limits our choice of oral anticoagulation and certainly is a problem long-term for full therapeutic dose anticoagulation.  She is at a  prohibitive risk for lifelong oral anticoagulation but is certainly a reasonable candidate for aspirin plus Plavix for 6 months which is an approved indication after transcatheter left atrial appendage occlusion.  She also has elevated has bled score.  Given these conditions, she is an excellent candidate for transcatheter left atrial appendage occlusion which has been recommended to her by her primary cardiac team.  Shared decision was made between the patient, myself and primary nonimplanting cardiologist, Dr. Barry Dienes to proceed along this route.  The patient arrived at this decision based upon an evidence-based tool which was provided to her from our institution.  2.  She has had a previous bypass and I am unable to obtain the operative report.  Recommend CTA to make sure that the appendage has not been ligated in the past  3.  No bridging required: Prohibitive bleeding risk for the patient  4.  Given her history of cardiac arrest, low EF, AICD, severe tricuspid insufficiency and large right side, my suggestion would be to consider general anesthesia with TEE/CLE guidance as the first-line approach.  If she is not a good candidate for general anesthesia based upon preanesthetic evaluation, the second option would be ice plus moderate conscious sedation.    Shared Decision Making Note for Transcatheter Left Atrial Appendage Closure [facility]) for Non-Valvular Atrial Fibrillation    This patient is being referred to the [facility] at the [facility] [place] [facility] for evaluation for Left Atrial Appendage Closure [facility]) for management of stroke risk resulting from non-valvular atrial fibrillation (nonvalvular atrial fibrillation constitutes atrial fibrillation in the absence of moderate to severe mitral stenosis or any mechanical valve based on 2019 focused update on the management of atrial fibrillation from ACC/AHA)    Based on their past history and an evidence based tool, it has been determined that they are poor candidates for long-term oral-anticoagulation, however may be tolerant of short term treatment with warfarin or other direct acting oral anticoagulants as necessary.     Risk, benefits, alternatives of the procedure (technique of transcatheter left atrial appendage occlusion, need for general anesthesia, procedure related complications including transseptal related complications, intraprocedural stroke, bleeding, pericardial effusion, perforation needing cardiac surgery etc. and in addition postprocedural care which includes oral anticoagulation/antiplatelet therapy for up to 6 months, repeat transesophageal echocardiography for follow-up assessment of DART/leaks of clinical significance etc. )were explained to the patient who verbalized understanding of these conditions and consented to the procedure.  I have also explained to the patient the incidence of device related thrombosis, clinically significant peridevice leaks communicating with the appendage with the possibility of prolonging oral antiplatelet/anticoagulation.  The patient verbalized understanding of these conditions and consented to the procedure.     Their individual CHA2DS2-VASc stroke risk score, based on past history is  indicated below:      The patient's current CHA2DS2-VASC score is: Congestive heart failure, female gender, vascular disease, prior MI, PAD: Total 4    The patient's high bleeding risk on anticoagulation has been based upon the following clinical factors:          Current HAS-BLED score of the patient is abnormal renal function, bleeding, elderly, drugs: Aspirin plus Plavix: Total 4    We have discussed their unique stroke and bleeding risk both on and off oral-anticoagulation, and the rationale for this referral for transcatheter left atrial appendage closure.     Based on both stroke and bleeding risk, a shared decision has been made to pursue transcatheter  closure of the left atrial appendage as a safe and effective alternative to oral anticoagulant therapy for stroke prevention and to reduce their long term risk of incidence of intra cerebral bleeding.           There were no vitals filed for this visit.  There is no height or weight on file to calculate BMI.     Past Medical History  Patient Active Problem List    Diagnosis Date Noted    Shortness of breath 08/29/2022    Paroxysmal atrial fibrillation (HCC) 07/21/2022    Anemia 07/20/2022    GI bleed 07/20/2022    Leukocytosis 10/24/2018    NSVT (nonsustained ventricular tachycardia) (HCC) 10/24/2018    AKI (acute kidney injury) (HCC) 10/24/2018    Acute respiratory failure with hypoxia (HCC) 10/22/2018    Acute on chronic systolic heart failure (HCC) 04/07/2018    COPD (chronic obstructive pulmonary disease) (HCC) 04/07/2018    Anxiety 04/07/2018    ICD (implantable cardioverter-defibrillator), dual, 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  Mild to moderate mitral valve regurgitation. Moderate pulmonary HTN, PAP-51  08/31/2022-ECHO: he left ventricle is severely dilated, severely reduced function, ejection fraction 27%, global hypokinesis with regional variation.  Moderate diastolic dysfunction.The right ventricle is mildly dilated, mildly reduced function. Pacemaker or ICD lead is noted  in right atrium and right ventricle. Mild biatrial enlargement. Moderate mitral valve regurgitation. Severe tricuspid valve regurgitation (hepatic vein flow reversal noted). Trivial pericardial effusion, left pleural effusion. Pulmonary hypertension, estimated PA systolic pressure 60 mmHg, mildly elevated central venous pressure.      History of cardiac arrest with ventricular fibrillation (HCC) 04/01/2018    Pulmonary edema 03/31/2018    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       12/03 - CABG x 4 ([facility]): LIMA to LAD, SVG to distal RCA, SSVG-OM1, OM2.      Coronary artery disease of native artery of native heart with stable angina pectoris (HCC) 04/24/2009       5/01 - Non-Q MI-transferred to [facility]        5/01 - Cardiac cath: 40% distal LM, 80% narrowing of small DX, 50-60% proximal                   LAD, LCX 30-50% in OM1, 70% in OM2 distal LCX 50-60%, RCA had                   high-grade stenosis with appearance of ruptured plaque in proximal RCA        5/01 - PTCA/stent to RCA.        1/03 - PTCA/stent to LAD [facility]): 2.5x12 SciMed stent; 30% LM at ostium and 30-40%                   at distal portion of LM; 1LAD proximal 80-90%, status post PCI and stent;  three tiny DXs without any lesions; LCX 30% at prox portion with diffuse, mild,                   luminal irregularities throughout 3 OMs; RCA 30-40% in distal portion with luminal                  irregularities, LVEF 65% with normal LV wall motion.        5/03 - PCI/brachytherapy to LAD; EF 40%, distal lt main 40% (at bifurcation                   to LAD and LCX), prox 1st Dx 20%, prox posterolateral branch 30%,                   prox LAD 90% (in-stent), mid LAD 30%, RCA stent patent with 30%                   irregularities distally        12/03 - Admit [place] for cath d/t recurrent chest pressure; cath showed severe 3-                   vessel disease.  Surgical consult.        12/03 - CABG x 4 ([facility]): LIMA to LAD, SVG to distal RCA, SSVG-OM1, OM2.         07/19 - PCI to mid circumflex    10/27/18: Cath after small NSTEMI:  Severe 3-vessel coronary artery disease as described above.  Patent left main and proximal left circumflex stent as well as mid left circumflex stent.  Unchanged from intervention in July 2013.  Patent saphenous vein graft to distal right coronary artery.  Patent left internal mammary artery to left anterior descending artery.  100% saphenous vein graft occlusion to an obtuse marginal branch (previous angiography in July 2019 revealed severe diffuse disease with poor flow and subtending a small territory).  This may be the culprit for the patient's small non-ST-elevation myocardial infarction, but it is not amenable to revascularization.      Hyperlipidemia 04/24/2009      5/01 - FLP: Total 168, Trigs 125, HDL 43, LDL 100.         02/01/00 - Zocor started.         9/01 - Total 115, Trigs 56, HDL 45, LDL 59.  Initiated Lipitor 20mg .         10/01 - Discontinue Lipitor and recheck lab values in 3-4 months.      Peripheral vascular disease (HCC) 04/24/2009     Inability to advance up right femoral artery, hence procedure done on left side.        3/03 - Distal left common iliac 50-70%, mid right SFA 50%, Lt 70-80% stenosis of                    external iliac, mild disease in SFA with an occlusion of posterior tibial;                   PTA of right common iliac using a 6.0 x 29 Genesis stent, left common iliac                   PTA with a 5.0 x 28 CIQ stent.         5/04 - Abdominal aortic angiography with bilateral ileo-femoral runoff.  PTA/stent to  left external iliac artery (re-cannulization of total occlusion; 6.0- x 100-mm                   Smart stent device deployed and post dilated in its mid and distal portions with                   5.0-mm balloon; post dilated proximally with a 6.0-mm balloon).           6/04 - LE venous duplex for limb swelling/numbness: Mild common femoral vein reflux.                    Otherwise unremarkable study.  No evidence for CVT.          10/07 - Attempted PTA for left iliac artery occlusion.  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 Findings are suggestive of proximal stenosis or peripheral vasodilation in the upper extremity arterial vasculature. No high-grade stenosis is directly visualized by ultrasound. Follow-up CT angiogram of the chest in the arterial phase may be helpful for additional evaluation/characterization, if indicated. Mild velocity elevation in the left proximal subclavian artery and proximal brachial artery suggestive of mild to moderate stenosis.       History of atrial fibrillation 04/24/2009      1/04 - Post-operative a-fib. Converted with IV amiodarone      Tobacco abuse 04/24/2009         Review of Systems   Constitutional: Negative.   HENT: Negative.     Eyes: Negative.    Cardiovascular: Negative.    Respiratory: Negative.     Endocrine: Negative.    Hematologic/Lymphatic: Negative.    Skin: Negative.    Musculoskeletal: Negative.    Gastrointestinal: Negative.    Genitourinary: Negative.    Neurological: Negative.    Psychiatric/Behavioral: Negative.     Allergic/Immunologic: Negative.    All other systems reviewed and are negative.    Physical Exam  Virtual visit and hence physical examination was not performed.    Cardiovascular Health Factors  Vitals BP Readings from Last 3 Encounters:   09/22/22 108/62   09/04/22 103/49   07/21/22 126/60     Wt Readings from Last 3 Encounters:   09/22/22 59.9 kg (132 lb)   09/03/22 62.1 kg (137 lb)   07/21/22 64.6 kg (142 lb 6.4 oz)     BMI Readings from Last 3 Encounters:   09/22/22 29.59 kg/m?   09/03/22 30.71 kg/m?   07/21/22 31.93 kg/m?      Smoking Social History     Tobacco Use   Smoking Status Former    Packs/day: 0    Types: Cigarettes    Quit date: 02/28/2018    Years since quitting: 4.5   Smokeless Tobacco Never      Lipid Profile Cholesterol   Date Value Ref Range Status   12/02/2020 98  Final     HDL   Date Value Ref Range Status   12/02/2020 38 (L) >=40 Final     LDL   Date Value Ref Range Status   12/02/2020 47  Final     Triglycerides   Date Value Ref Range Status   12/02/2020 64  Final      Blood Sugar Hemoglobin  A1C   Date Value Ref Range Status   02/10/2014 5.3 4.0 - 6.0 % Final     Comment:     The ADA recommends that most patients with type 1 and type 2 diabetes maintain   an A1c level <7%.       Glucose   Date Value Ref Range Status   09/04/2022 79 70 - 100 MG/DL Final   16/06/9603 540 (H) 70 - 100 MG/DL Final   98/07/9146 90 70 - 100 MG/DL Final   82/95/6213 91 70 - 110 MG/DL Final   08/65/7846 962 70 - 110 MG/DL Final   95/28/4132 440 70 - 110 MG/DL Final     Glucose, POC   Date Value Ref Range Status   10/27/2018 101 (H) 70 - 100 MG/DL Final   07/18/2535 644 (H) 70 - 110 MG/DL Final   03/47/4259 563 (H) 70 - 110 MG/DL Final            Current Medications (including today's revisions)   acetaminophen (TYLENOL) 325 mg tablet Take one tablet by mouth every 4 hours as needed for Pain.    albuterol (VENTOLIN HFA, PROAIR HFA) 90 mcg/actuation inhaler Inhale one puff to two puffs by mouth into the lungs as Needed for Wheezing.    albuterol-ipratropium (DUONEB) 0.5 mg-3 mg(2.5 mg base)/3 mL nebulizer solution Inhale 3 mL solution by nebulizer as directed every 4 hours as needed for Wheezing.    allopurinoL (ZYLOPRIM) 100 mg tablet Take one tablet by mouth daily. Take with food.    Aspirin 81 mg Tab Take one tablet by mouth daily.    atorvastatin (LIPITOR) 40 mg tablet Take one tablet by mouth daily.    bumetanide (BUMEX) 2 mg tablet Take one tablet by mouth daily.    clopiDOGrel (PLAVIX) 75 mg tablet Take one tablet by mouth daily.    colchicine (COLCRYS) 0.6 mg tablet Take one tablet by mouth twice daily.    dapagliflozin propanediol (FARXIGA) 10 mg tablet Take one tablet by mouth daily. Indications: heart failure with reduced ejection fraction    diclofenac sodium (VOLTAREN) 1 % topical gel Apply four g topically to affected area four times daily.    FLUoxetine (PROZAC) 10 mg capsule Take one capsule by mouth daily.    fluticasone-umeclidin-vilanter (TRELEGY ELLIPTA) 100-62.5-25 mcg inhaler Inhale one puff by mouth into the lungs daily.    hydrOXYzine HCL (ATARAX) 25 mg tablet Take one tablet by mouth as Needed.    metoprolol succinate XL (TOPROL XL) 25 mg extended release tablet Take one tablet by mouth at bedtime daily.    pantoprazole DR (PROTONIX) 40 mg tablet Take one tablet by mouth twice daily.    potassium chloride SR (K-DUR) 20 mEq tablet Take one tablet by mouth daily. Take with a meal and a full glass of water. **Take one tablet by mouth twice daily for three days, then resume previous dose.**    pregabalin (LYRICA) 50 mg capsule Take one capsule by mouth twice daily.    spironolactone (ALDACTONE) 25 mg tablet Take one tablet by mouth daily. Take with food.

## 2022-09-30 DIAGNOSIS — I480 Paroxysmal atrial fibrillation: Secondary | ICD-10-CM

## 2022-10-01 ENCOUNTER — Encounter: Admit: 2022-10-01 | Discharge: 2022-10-01 | Payer: MEDICARE

## 2022-10-01 DIAGNOSIS — I480 Paroxysmal atrial fibrillation: Secondary | ICD-10-CM

## 2022-10-02 ENCOUNTER — Encounter: Admit: 2022-10-02 | Discharge: 2022-10-02 | Payer: MEDICARE

## 2022-10-02 NOTE — Telephone Encounter
Patient denies issues.  She was advised to call if weight gain, shortness of breath or edema.

## 2022-10-02 NOTE — Telephone Encounter
-----  Message from Vickie Epley, RN sent at 10/02/2022  8:07 AM CST -----  Regarding: FW: HeartLogic alert.    ----- Message -----  From: Mercy Riding  Sent: 10/02/2022   7:24 AM CST  To: Cvm Nurse Gen Card Team Red  Subject: HeartLogic alert.                                A Yellow Alert was reported by the device:    HeartLogic Heart Failure Index remains above the alert recovery threshold of 6.  HeartLogic is at 13. Contributing factor is S3.    Additional Notes:  Device function appears normal. Current rhythm AP-VS/AS-VS 60s bpm with one PVC and one conducted PAC. 6 AT/AF episodes, longest 2.5 minutes. Egms showed AT (some possible AFL). History of PAF.

## 2022-10-08 ENCOUNTER — Encounter: Admit: 2022-10-08 | Discharge: 2022-10-08 | Payer: MEDICARE

## 2022-10-08 ENCOUNTER — Inpatient Hospital Stay: Admit: 2022-10-08 | Discharge: 2022-10-08 | Payer: MEDICARE

## 2022-10-08 DIAGNOSIS — I480 Paroxysmal atrial fibrillation: Secondary | ICD-10-CM

## 2022-10-08 MED ORDER — ACETAMINOPHEN 325 MG PO TAB
650 mg | ORAL | 0 refills | PRN
Start: 2022-10-08 — End: ?

## 2022-10-08 MED ORDER — ALUMINUM-MAGNESIUM HYDROXIDE 200-200 MG/5 ML PO SUSP
30 mL | ORAL | 0 refills | PRN
Start: 2022-10-08 — End: ?

## 2022-10-08 MED ORDER — ASPIRIN 325 MG PO TAB
325 mg | Freq: Once | ORAL | 0 refills
Start: 2022-10-08 — End: ?

## 2022-10-08 MED ORDER — VANCOMYCIN 1G/250ML D5W IVPB (VIAL2BAG)
15 mg/kg | Freq: Two times a day (BID) | INTRAVENOUS | 0 refills
Start: 2022-10-08 — End: ?

## 2022-10-08 MED ORDER — MAGNESIUM HYDROXIDE 400 MG/5 ML PO SUSP
30 mL | ORAL | 0 refills | PRN
Start: 2022-10-08 — End: ?

## 2022-10-08 NOTE — Telephone Encounter
-----  Message from Alfonse Ras, [place] sent at 09/29/2022  3:33 PM CST -----  Regarding: Watchman  Okay for Watchman  CTA prior  TEE/GA/CLE  No bridging

## 2022-10-08 NOTE — Telephone Encounter
Called patient to schedule Watchman implant with Dr. Dinah Beers  Patient agreed to date of service, 02/02/2023  Pre procedure appointments discussed.  Patient verbalized understanding of dates/times/locations of all appointments.  Pre procedure instructions sent via mail per request.

## 2022-10-09 ENCOUNTER — Encounter: Admit: 2022-10-09 | Discharge: 2022-10-09 | Payer: MEDICARE

## 2022-10-09 NOTE — Telephone Encounter
-----  Message from [place] sent at 10/09/2022 11:02 AM CST -----  Regarding: Yellow Alert: Elevated Heart Logic  Good Morning,    A Yellow Alert was reported by the device:    HeartLogic Heart Failure Index remains above the alert recovery threshold of 6.  Current HL: 10  Pt active 0.3h/d.  Contributing Factors:  S3: 0.87mG   S3/S1 Ratio: 0.52mG     Number of PVCs from counters: 130.52 per hour    Please review remote ICD note from 10/09/22 for further details.    Thank you,    Paden

## 2022-10-20 ENCOUNTER — Encounter: Admit: 2022-10-20 | Discharge: 2022-10-20 | Payer: MEDICARE

## 2022-10-22 ENCOUNTER — Encounter: Admit: 2022-10-22 | Discharge: 2022-10-22 | Payer: MEDICARE

## 2022-10-22 DIAGNOSIS — I4729 NSVT (nonsustained ventricular tachycardia) (HCC): Secondary | ICD-10-CM

## 2022-10-22 DIAGNOSIS — Z95810 Presence of automatic (implantable) cardiac defibrillator: Secondary | ICD-10-CM

## 2022-10-22 DIAGNOSIS — I480 Paroxysmal atrial fibrillation: Secondary | ICD-10-CM

## 2022-10-23 ENCOUNTER — Encounter: Admit: 2022-10-23 | Discharge: 2022-10-23 | Payer: MEDICARE

## 2022-11-25 ENCOUNTER — Encounter: Admit: 2022-11-25 | Discharge: 2022-11-25 | Payer: MEDICARE

## 2022-12-01 ENCOUNTER — Encounter: Admit: 2022-12-01 | Discharge: 2022-12-01 | Payer: MEDICARE

## 2022-12-30 ENCOUNTER — Encounter: Admit: 2022-12-30 | Discharge: 2022-12-30 | Payer: MEDICARE

## 2022-12-30 NOTE — Telephone Encounter
Brittany Sarah, MD  Rogelia Boga, RN  Caller: Unspecified (Today,  1:41 PM)  Her primary should order the BIPAP or CPAP, whatever is appropriate.  She could double her Bumex for 3 days and double potassium also to see if that helps with the swelling.  Thanks.    Called and discussed recommendations with patient.  She is agreeable to plan.  Will callback with any questions, concerns or problems.

## 2022-12-30 NOTE — Telephone Encounter
Received a call from [place], [place] [phone]) who left a message regarding Brittany Mccormick.  She states that Brittany Mccormick has told her that her insurance case manager wants her to be set up with a Bipap machine to help with her breathing.  She wanted to know if Dr. Barry Dienes thinks this is a good idea and if he would be okay with her ordering.    She also states that patient called into their nursing line with complaints of increased shortness of breath.  They are going to order labs, but she instructed Brittany Mccormick to also call us.    Brittany Mccormick also called and left a VM.  She states that she is having swelling in her legs from her feet to her thighs and feels like her legs are tight.  She wanted to know if she should increase her bumex again.  She also mentions the need for Bipap and wants to know who will be ordering this.  She did not mention shortness of breath in her VM.    I attempted to reach [facility] by phone and left VM requesting callback to discuss symptoms and verify medications.  Will update once I am able to speak with Brittany Mccormick.    Will route to [facility] for review and recommendations.

## 2022-12-30 NOTE — Telephone Encounter
Liviah called back and states that her PCP recommended that she call.  She states that she has swelling from her feet to her things.  It does go down with elevation, but comes back as soon as she gets up.  Citlally reports that she has gained 5 pounds in 3 days.  She has been limiting her fluids, does not get over 24 ounces of water/tea.  Marijayne states she feels her shortness of breath is a little worse, especially at bedtime.  She is wearing 3L of Oxygen which is her normal.  She states that she is frustrated because she feels okay overall, but gets short of breath walking and at bedtime and she doesn't understand why she is having the swelling.  Edelle states that her vitals have been good, but can not find her BP log that her [facility] keeps.      Verified medications with [name].  She confirmed that she is taking Bumex 2mg  daily, KCL 20 mEq daily and Spironolactone 25mg  daily.  She states that she took an extra bumex and potassium yesterday and has not noticed any change or improvement in her symptoms.    Hanny also asked about Bipap and informed her that her PCP would need to order that from [facility].  She verbalized understanding.    Will route to [facility] for review and recommendations.

## 2023-01-05 ENCOUNTER — Encounter: Admit: 2023-01-05 | Discharge: 2023-01-05 | Payer: MEDICARE

## 2023-01-08 ENCOUNTER — Encounter: Admit: 2023-01-08 | Discharge: 2023-01-08 | Payer: MEDICARE

## 2023-01-11 ENCOUNTER — Encounter: Admit: 2023-01-11 | Discharge: 2023-01-11 | Payer: MEDICARE

## 2023-01-11 NOTE — Patient Instructions
Coronary CT Angiography Instructions    Brittany Mccormick  [id number]  1956-09-18  01/11/2023    ARRIVAL TIME    Please report to the Cardiovascular Medicine Clinic at the [facility] on: 01/21/23  Please arrive at the following time: 1000 for CT, 1220 PAC and 230 office visit      DO NOT EAT FOR 4 HOURS PRIOR TO YOUR PROCEDURE.  YOU SHOULD DRINK PLENTY OF CLEAR LIQUIDS UP TO ARRIVAL AT OFFICE.       Do not take any non-steroidal inflammatory medications, (ibuprofen, Aleve, Advil, etc.) for 48 hours beginning the day of the procedure.    Hold All Diuretics For 24 Hours Beginning The Day Of The Procedure: Verified    You May Take All Other Medications With Water The Morning Of The Procedure: Verified         If you have any questions, please call the [facility] office at [phone].

## 2023-01-19 ENCOUNTER — Encounter: Admit: 2023-01-19 | Discharge: 2023-01-19 | Payer: MEDICARE

## 2023-01-20 ENCOUNTER — Encounter: Admit: 2023-01-20 | Discharge: 2023-01-20 | Payer: MEDICARE

## 2023-01-21 ENCOUNTER — Encounter: Admit: 2023-01-21 | Discharge: 2023-01-21 | Payer: MEDICARE

## 2023-01-22 ENCOUNTER — Inpatient Hospital Stay: Admit: 2023-01-22 | Discharge: 2023-01-22 | Payer: MEDICARE

## 2023-01-22 ENCOUNTER — Encounter: Admit: 2023-01-22 | Discharge: 2023-01-22 | Payer: MEDICARE

## 2023-01-22 ENCOUNTER — Inpatient Hospital Stay: Admit: 2023-01-22 | Payer: MEDICARE

## 2023-01-22 ENCOUNTER — Ambulatory Visit: Admit: 2023-01-22 | Discharge: 2023-01-22 | Payer: MEDICARE

## 2023-01-22 DIAGNOSIS — I509 Heart failure, unspecified: Secondary | ICD-10-CM

## 2023-01-22 DIAGNOSIS — I4891 Unspecified atrial fibrillation: Secondary | ICD-10-CM

## 2023-01-22 LAB — PROTIME INR (PT)
INR: 1.4 % — ABNORMAL HIGH (ref 0.9–1.2)
PROTIME: 15 s — ABNORMAL HIGH (ref 10.2–12.9)

## 2023-01-22 LAB — COMPREHENSIVE METABOLIC PANEL
ALBUMIN: 3.5 g/dL (ref 3.5–5.0)
ALK PHOSPHATASE: 142 U/L — ABNORMAL HIGH (ref 25–110)
ALT: 17 U/L (ref 7–56)
ANION GAP: 13 — ABNORMAL HIGH (ref 3–12)
AST: 36 U/L (ref 7–40)
CALCIUM: 9.1 mg/dL (ref 8.5–10.6)
CO2: 25 MMOL/L (ref 21–30)
CREATININE: 1.7 mg/dL — ABNORMAL HIGH (ref 0.4–1.00)
EGFR: 32 mL/min — ABNORMAL LOW (ref >60)
GLUCOSE,PANEL: 120 mg/dL — ABNORMAL HIGH (ref 70–100)
POTASSIUM: 3.4 MMOL/L — ABNORMAL LOW (ref 3.5–5.1)
SODIUM: 136 MMOL/L — ABNORMAL LOW (ref 137–147)
TOTAL BILIRUBIN: 1.1 mg/dL (ref 0.3–1.2)
TOTAL PROTEIN: 6.9 g/dL (ref 6.0–8.0)

## 2023-01-22 LAB — IRON + BINDING CAPACITY + %SAT+ FERRITIN
% SATURATION: 165 % — ABNORMAL HIGH (ref 28–42)
FERRITIN: 420 ng/mL — ABNORMAL HIGH (ref 10–200)
IRON BINDING: 507 ug/dL — ABNORMAL HIGH (ref 270–380)
IRON: 836 ug/dL — ABNORMAL HIGH (ref 50–160)

## 2023-01-22 LAB — BLOOD GASES, PERIPHERAL VENOUS
BASE EXCESS-VENOUS: 2.6 MMOL/L
BICARB, VENOUS(CAL): 26 MMOL/L
O2 SAT, VEN(CALC.): 58 % (ref 55–71)
PCO2-VENOUS: 45 mmHg (ref 36–50)
PH-VENOUS: 7.4 (ref 7.30–7.40)
PO2-VENOUS: 39 mmHg (ref 33–48)

## 2023-01-22 LAB — HIGH SENSITIVITY TROPONIN I 0 HOUR: HIGH SENSITIVITY TROPONIN I 0 HOUR: 13 ng/L — ABNORMAL HIGH (ref <12)

## 2023-01-22 LAB — TSH WITH FREE T4 REFLEX: TSH: 1 uU/mL (ref 0.35–5.00)

## 2023-01-22 LAB — HIGH SENSITIVITY TROPONIN I, RANDOM: HIGH SENSITIVITY TROPONIN I: 15 ng/L — ABNORMAL HIGH (ref <12)

## 2023-01-22 LAB — CBC AND DIFF
ABSOLUTE BASO COUNT: 0 K/UL (ref 0–0.20)
ABSOLUTE EOS COUNT: 0 K/UL (ref 0–0.45)
ABSOLUTE LYMPH COUNT: 0.7 K/UL — ABNORMAL LOW (ref 1.0–4.8)
ABSOLUTE MONO COUNT: 1 K/UL — ABNORMAL HIGH (ref 0–0.80)
ABSOLUTE NEUTROPHIL: 7.6 K/UL — ABNORMAL HIGH (ref 1.8–7.0)
BASOPHILS %: 0 % (ref 0–2)
EOSINOPHILS %: 0 % (ref 0–5)
LYMPHOCYTES %: 8 % — ABNORMAL LOW (ref 24–44)
MCH: 30 pg (ref 26–34)
MCV: 92 FL (ref 80–100)
MONOCYTES %: 11 % (ref 4–12)
MPV: 13 FL — ABNORMAL HIGH (ref 7–11)
NEUTROPHILS %: 81 % — ABNORMAL HIGH (ref 41–77)
PLATELET COUNT: 127 K/UL — ABNORMAL LOW (ref 150–400)
WBC COUNT: 9.5 K/UL (ref 4.5–11.0)

## 2023-01-22 LAB — MAGNESIUM: MAGNESIUM: 1.5 mg/dL — ABNORMAL LOW (ref 1.6–2.6)

## 2023-01-22 LAB — PTT (APTT): PTT: 29 s — ABNORMAL LOW (ref 24.0–36.5)

## 2023-01-22 MED ORDER — ASPIRIN 81 MG PO TBEC
81 mg | Freq: Every day | ORAL | 0 refills | Status: DC
Start: 2023-01-22 — End: 2023-01-23

## 2023-01-22 MED ORDER — PROPOFOL 10 MG/ML IV EMUL
5-70 ug/kg/min | INTRAVENOUS | 0 refills | Status: AC
Start: 2023-01-22 — End: ?
  Administered 2023-01-23: 20:00:00 15 ug/kg/min via INTRAVENOUS
  Administered 2023-01-23: 11:00:00 35 ug/kg/min via INTRAVENOUS
  Administered 2023-01-24: 23:00:00 5 ug/kg/min via INTRAVENOUS
  Administered 2023-01-24: 08:00:00 15 ug/kg/min via INTRAVENOUS

## 2023-01-22 MED ORDER — MAGNESIUM SULFATE IN D5W 1 GRAM/100 ML IV PGBK
1 g | INTRAVENOUS | 0 refills | Status: AC
Start: 2023-01-22 — End: ?

## 2023-01-22 MED ORDER — ATORVASTATIN 40 MG PO TAB
40 mg | Freq: Every day | ORAL | 0 refills | Status: AC
Start: 2023-01-22 — End: ?
  Administered 2023-01-23 – 2023-01-26 (×4): 40 mg via ORAL

## 2023-01-22 MED ORDER — UMECLIDINIUM-VILANTEROL 62.5-25 MCG/ACTUATION IN DSDV
1 | Freq: Every day | RESPIRATORY_TRACT | 0 refills | Status: AC
Start: 2023-01-22 — End: ?
  Administered 2023-01-30: 14:00:00 1 via RESPIRATORY_TRACT

## 2023-01-22 MED ORDER — PREGABALIN 25 MG PO CAP
50 mg | Freq: Two times a day (BID) | ORAL | 0 refills | Status: AC
Start: 2023-01-22 — End: ?

## 2023-01-22 MED ORDER — METOPROLOL SUCCINATE 25 MG PO TB24
25 mg | Freq: Every evening | ORAL | 0 refills | Status: AC
Start: 2023-01-22 — End: ?
  Administered 2023-01-23 – 2023-01-24 (×2): 25 mg via ORAL

## 2023-01-22 MED ORDER — NOREPINEPHRINE BITARTRATE-NACL 4 MG/250 ML (16 MCG/ML) IV SOLN
0-.5 ug/kg/min | INTRAVENOUS | 0 refills | Status: AC
Start: 2023-01-22 — End: ?
  Administered 2023-01-23 – 2023-01-24 (×2): 0.08 ug/kg/min via INTRAVENOUS

## 2023-01-22 MED ORDER — IPRATROPIUM-ALBUTEROL 0.5 MG-3 MG(2.5 MG BASE)/3 ML IN NEBU
3 mL | RESPIRATORY_TRACT | 0 refills | Status: AC | PRN
Start: 2023-01-22 — End: ?
  Administered 2023-01-23 – 2023-02-03 (×46): 3 mL via RESPIRATORY_TRACT

## 2023-01-22 MED ORDER — FOLIC ACID 1 MG PO TAB
1 mg | Freq: Every day | ORAL | 0 refills | Status: AC
Start: 2023-01-22 — End: ?
  Administered 2023-01-23 – 2023-01-26 (×4): 1 mg via ORAL

## 2023-01-22 MED ORDER — OXYMETAZOLINE 0.05 % NA SPRY
2 | Freq: Two times a day (BID) | NASAL | 0 refills | Status: AC
Start: 2023-01-22 — End: ?

## 2023-01-22 MED ORDER — ALLOPURINOL 100 MG PO TAB
100 mg | Freq: Every day | ORAL | 0 refills | Status: DC
Start: 2023-01-22 — End: 2023-01-23

## 2023-01-22 MED ORDER — SPIRONOLACTONE 25 MG PO TAB
25 mg | Freq: Every day | ORAL | 0 refills | Status: DC
Start: 2023-01-22 — End: 2023-01-23

## 2023-01-22 MED ORDER — MAGNESIUM SULFATE IN D5W 1 GRAM/100 ML IV PGBK
1 g | INTRAVENOUS | 0 refills | Status: AC
Start: 2023-01-22 — End: ?
  Administered 2023-01-23: 07:00:00 1 g via INTRAVENOUS

## 2023-01-22 MED ORDER — BUMETANIDE 0.5 MG PO TAB
2 mg | Freq: Every day | ORAL | 0 refills | Status: DC
Start: 2023-01-22 — End: 2023-01-23

## 2023-01-22 MED ORDER — BUMETANIDE 0.25 MG/ML IJ SOLN
2 mg | Freq: Once | INTRAVENOUS | 0 refills | Status: CP
Start: 2023-01-22 — End: ?
  Administered 2023-01-23: 01:00:00 2 mg via INTRAVENOUS

## 2023-01-22 MED ORDER — IPRATROPIUM-ALBUTEROL 0.5 MG-3 MG(2.5 MG BASE)/3 ML IN NEBU
3 mL | RESPIRATORY_TRACT | 0 refills | Status: DC | PRN
Start: 2023-01-22 — End: 2023-01-23

## 2023-01-22 MED ORDER — COLCHICINE 0.6 MG PO TAB
.6 mg | Freq: Two times a day (BID) | ORAL | 0 refills | Status: AC
Start: 2023-01-22 — End: ?
  Administered 2023-01-23 – 2023-01-24 (×3): 0.6 mg via ORAL

## 2023-01-22 MED ORDER — ROCURONIUM 10 MG/ML IV SOLN
70 mg | Freq: Once | INTRAVENOUS | 0 refills | Status: AC
Start: 2023-01-22 — End: ?

## 2023-01-22 MED ORDER — FLUOXETINE 10 MG PO CAP
10 mg | Freq: Every day | ORAL | 0 refills | Status: AC
Start: 2023-01-22 — End: ?
  Administered 2023-01-23 – 2023-01-26 (×4): 10 mg via ORAL

## 2023-01-22 MED ORDER — FLUTICASONE FUROATE 100 MCG/ACTUATION IN DSDV
1 | Freq: Every day | RESPIRATORY_TRACT | 0 refills | Status: AC
Start: 2023-01-22 — End: ?
  Administered 2023-01-30: 14:00:00 1 via RESPIRATORY_TRACT

## 2023-01-22 MED ORDER — BUMETANIDE 0.25MG/ML IV DRIP (STD CONC)
1 mg/h | INTRAVENOUS | 0 refills | Status: DC
Start: 2023-01-22 — End: 2023-01-23

## 2023-01-22 MED ORDER — OXYMETAZOLINE 0.05 % NA SPRY
2 | Freq: Two times a day (BID) | NASAL | 0 refills | Status: DC
Start: 2023-01-22 — End: 2023-01-23

## 2023-01-22 MED ORDER — ENOXAPARIN 40 MG/0.4 ML SC SYRG
40 mg | Freq: Every day | SUBCUTANEOUS | 0 refills | Status: DC
Start: 2023-01-22 — End: 2023-01-23

## 2023-01-22 MED ORDER — DAPAGLIFLOZIN PROPANEDIOL 10 MG PO TAB
10 mg | Freq: Every day | ORAL | 0 refills | Status: DC
Start: 2023-01-22 — End: 2023-01-23

## 2023-01-22 MED ORDER — PHENYLEPHRINE HCL IN 0.9% NACL 1 MG/10 ML (100 MCG/ML) IV GROUP
200 ug | Freq: Once | INTRAVENOUS | 0 refills | Status: AC
Start: 2023-01-22 — End: ?

## 2023-01-22 MED ORDER — PANTOPRAZOLE 40 MG PO TBEC
40 mg | Freq: Two times a day (BID) | ORAL | 0 refills | Status: AC
Start: 2023-01-22 — End: ?
  Administered 2023-01-23 – 2023-01-24 (×3): 40 mg via ORAL

## 2023-01-22 MED ORDER — POTASSIUM CHLORIDE 20 MEQ PO TBTQ
20 meq | Freq: Every day | ORAL | 0 refills | Status: AC
Start: 2023-01-22 — End: ?

## 2023-01-22 MED ORDER — PROPOFOL INJ 10 MG/ML IV VIAL
40 mg | Freq: Once | INTRAVENOUS | 0 refills | Status: AC
Start: 2023-01-22 — End: ?

## 2023-01-22 NOTE — Telephone Encounter
-----   Message from Julienne Kass, MD sent at 01/22/2023 10:49 AM CDT -----  Regarding: RE: Watchman 5/14  yes, okay to do  Thanks    ----- Message -----  From: Tempie Hoist, BSN  Sent: 01/22/2023   7:46 AM CDT  To: Pernell Dupre, RN; Julienne Kass, MD  Subject: Watchman 5/14                                    Dr. Michela Pitcher did not make her CCTA appt.  Are you OK doing the procedure without the CCTA?    Judeth Cornfield

## 2023-01-23 ENCOUNTER — Inpatient Hospital Stay: Admit: 2023-01-23 | Discharge: 2023-01-23 | Payer: MEDICARE

## 2023-01-23 ENCOUNTER — Encounter: Admit: 2023-01-23 | Discharge: 2023-01-23 | Payer: MEDICARE

## 2023-01-23 ENCOUNTER — Inpatient Hospital Stay: Admit: 2023-01-23 | Discharge: 2023-01-22 | Payer: MEDICARE

## 2023-01-23 MED ADMIN — BUMETANIDE 0.25 MG/ML IJ SOLN [9308]: 1.5 mg/h | INTRAVENOUS | NDC 00641600701

## 2023-01-23 MED ADMIN — POTASSIUM CHLORIDE IN WATER 10 MEQ/50 ML IV PGBK [11075]: 10 meq | INTRAVENOUS | @ 09:00:00 | Stop: 2023-01-23 | NDC 00338070541

## 2023-01-23 MED ADMIN — BUMETANIDE 0.25 MG/ML IJ SOLN [9308]: 1 mg/h | INTRAVENOUS | @ 17:00:00 | NDC 00641600701

## 2023-01-23 MED ADMIN — POTASSIUM CHLORIDE IN WATER 10 MEQ/50 ML IV PGBK [11075]: 10 meq | INTRAVENOUS | @ 11:00:00 | Stop: 2023-01-23 | NDC 00338070541

## 2023-01-23 MED ADMIN — POTASSIUM CHLORIDE IN WATER 10 MEQ/50 ML IV PGBK [11075]: 10 meq | INTRAVENOUS | @ 13:00:00 | Stop: 2023-01-23 | NDC 00338070541

## 2023-01-23 MED ADMIN — FENTANYL CITRATE (PF) 50 MCG/ML IJ SOLN [3037]: 25 ug | INTRAVENOUS | @ 07:00:00 | NDC 00409909412

## 2023-01-23 MED ADMIN — POTASSIUM CHLORIDE IN WATER 10 MEQ/50 ML IV PGBK [11075]: 10 meq | INTRAVENOUS | @ 07:00:00 | Stop: 2023-01-23 | NDC 00338070541

## 2023-01-23 MED ADMIN — MAGNESIUM SULFATE IN D5W 1 GRAM/100 ML IV PGBK [166578]: 1 g | INTRAVENOUS | @ 08:00:00 | Stop: 2023-01-23 | NDC 00264440054

## 2023-01-23 MED ADMIN — PROPOFOL 10 MG/ML IV EMUL [11150]: 25 ug/kg/min | INTRAVENOUS | @ 04:00:00 | Stop: 2023-01-23 | NDC 63323026965

## 2023-01-23 MED ADMIN — POLYETHYLENE GLYCOL 3350 17 GRAM PO PWPK [25424]: 17 g | ORAL | @ 23:00:00 | NDC 00904693186

## 2023-01-23 MED ADMIN — MAGNESIUM SULFATE IN D5W 1 GRAM/100 ML IV PGBK [166578]: 1 g | INTRAVENOUS | @ 07:00:00 | Stop: 2023-01-23 | NDC 00264440054

## 2023-01-23 MED ADMIN — SODIUM CHLORIDE 0.65 % NA SPRA [29676]: 2 | NASAL | @ 11:00:00 | NDC 00904386575

## 2023-01-23 MED ADMIN — ALBUMIN, HUMAN 5 % IV SOLP [8982]: 250 mL | INTRAVENOUS | @ 06:00:00 | Stop: 2023-01-23 | NDC 68516521403

## 2023-01-23 MED ADMIN — BUMETANIDE 0.25 MG/ML IJ SOLN [9308]: 2 mg | INTRAVENOUS | @ 17:00:00 | Stop: 2023-01-23 | NDC 65219057001

## 2023-01-23 NOTE — Progress Notes
RT Adult Assessment Note    NAME:Brittany Mccormick             MRN: [id number]             DOB:03/19/1956          AGE: 67 y.o.  ADMISSION DATE: 01/22/2023             DAYS ADMITTED: LOS: 0 days    Additional Comments:  Impressions of the patient: Alert and laying in bed. Patient wanting to eat and talk to staff. No pulm reserve. Desats on 15L/min when talking.   Intervention(s)/outcome(s): Completed RT adult evaluation. Will need PAP therapy for lung expansion to treat chest xray and increase in O2 need.  Recommendations to the care team: Subbed Arnuity and Anorro for Trelegy patient takes sometimes at home. Patient states she has sleep apnea, but only uses O2 via cannula at home.      Vital Signs:  Pulse: 65  RR: 17 PER MINUTE  SpO2: 93 %  O2 Device: High flow nasal cannula  Liter Flow: 15 Lpm  O2%:      Breath Sounds:   Breath Sounds WDL: Exceptions to WDL  Right Apex Breath Sounds: Clear (Implies normal)  Right Middle Lobe Breath Sounds: Clear (Implies normal);Decreased  Right Base Breath Sounds: Decreased  Left Apex Breath Sounds: Clear (Implies normal)  Left Base Breath Sounds: Clear (Implies normal);Decreased  Respiratory Effort:   Respiratory WDL: Exceptions to WDL  Respiratory Effort/Pattern: SOA (Short of air);Dyspnea  Comments: OSA with home O2 @ NOC

## 2023-01-23 NOTE — Anesthesia Procedure Notes
Procedure: Airway Placement    AIRWAY INSERTION    Date/Time: 01/22/2023 11:30 PM    Patient location: ICU  Urgency: emergent  Difficult Airway: No            Airway Procedure  Indication(s) for airway management: respiratory distress        Rapid Sequence Induction (RSI) used: yes    Preoxygenated: yes  Patient position: sniffing    Mask difficulty assessment: 1 - vent by mask      Procedure Outcome  Final airway type: endotracheal airway  Endotracheal airway: ETT          ETT size (mm): 7.0  Technique used for successful ETT placement: direct laryngoscopy  Devices/methods used in placement: intubating stylet  Insertion site: oral  Blade type: Macintosh   Laryngoscope/Videolaryngoscope blade size: 3  Cormack-Lehane classification: grade I - full view of glottis      Measured from: gums   Depth: 21 cm  Amount of Air in Cuff: 7 ml  Number of attempts at approach: 1  Placement verified by auscultation and capnometry            Complications  Cardiovascular:   Pulmonary:   Procedure: airway not difficult  Medication:         Performed by: Dionne Bucy, MD  Authorized by: Sabino Gasser, MD

## 2023-01-24 ENCOUNTER — Inpatient Hospital Stay: Admit: 2023-01-24 | Discharge: 2023-01-24 | Payer: MEDICARE

## 2023-01-24 MED ADMIN — METRONIDAZOLE IN NACL (ISO-OS) 500 MG/100 ML IV PGBK [5018]: 500 mg | INTRAVENOUS | @ 05:00:00 | NDC 00409015201

## 2023-01-24 MED ADMIN — SENNOSIDES-DOCUSATE SODIUM 8.6-50 MG PO TAB [40926]: 1 | ORAL | @ 01:00:00 | NDC 00536124810

## 2023-01-24 MED ADMIN — HYDROCORTISONE SOD SUCC (PF) 100 MG/2 ML IJ SOLR [302326]: 50 mg | INTRAVENOUS | @ 23:00:00 | NDC 00009001103

## 2023-01-24 MED ADMIN — MAGNESIUM SULFATE IN D5W 1 GRAM/100 ML IV PGBK [166578]: 1 g | INTRAVENOUS | @ 11:00:00 | Stop: 2023-01-24 | NDC 00264440054

## 2023-01-24 MED ADMIN — MAGNESIUM SULFATE IN D5W 1 GRAM/100 ML IV PGBK [166578]: 1 g | INTRAVENOUS | @ 12:00:00 | Stop: 2023-01-24 | NDC 00264440054

## 2023-01-24 MED ADMIN — LINEZOLID IN DEXTROSE 5% 600 MG/300 ML IV PGBK [86283]: 600 mg | INTRAVENOUS | @ 05:00:00 | NDC 00009514001

## 2023-01-24 MED ADMIN — LIDOCAINE (PF) 10 MG/ML (1 %) IJ SOLN [95838]: 2 mL | SUBCUTANEOUS | @ 17:00:00 | Stop: 2023-01-24 | NDC 63323049204

## 2023-01-24 MED ADMIN — METRONIDAZOLE IN NACL (ISO-OS) 500 MG/100 ML IV PGBK [5018]: 500 mg | INTRAVENOUS | @ 14:00:00 | NDC 47335099301

## 2023-01-24 MED ADMIN — SODIUM CHLORIDE 0.9 % IV PGBK (MB+) [95161]: 2 g | INTRAVENOUS | @ 05:00:00 | NDC 00338915930

## 2023-01-24 MED ADMIN — BUMETANIDE 0.25 MG/ML IJ SOLN [9308]: 1.5 mg/h | INTRAVENOUS | @ 07:00:00 | NDC 00641600701

## 2023-01-24 MED ADMIN — VASOPRESSIN 0.2 UNIT/ML IV SOLN [458566]: 1.8 [IU]/h | INTRAVENOUS | @ 13:00:00 | Stop: 2023-01-24 | NDC 42023023701

## 2023-01-24 MED ADMIN — POTASSIUM CHLORIDE 20 MEQ/15 ML PO LIQD [6432]: 60 meq | OROGASTRIC | @ 21:00:00 | Stop: 2023-01-24 | NDC 50268067411

## 2023-01-24 MED ADMIN — POTASSIUM CHLORIDE IN WATER 10 MEQ/50 ML IV PGBK [11075]: 10 meq | INTRAVENOUS | @ 22:00:00 | Stop: 2023-01-24 | NDC 00338070541

## 2023-01-24 MED ADMIN — SENNOSIDES-DOCUSATE SODIUM 8.6-50 MG PO TAB [40926]: 1 | ORAL | @ 14:00:00 | NDC 00536124810

## 2023-01-24 MED ADMIN — DEXTROSE 5% IN WATER IV SOLP [2364]: 0.09 ug/kg/min | INTRAVENOUS | @ 12:00:00 | NDC 00338001702

## 2023-01-24 MED ADMIN — CEFEPIME 2 GRAM IJ SOLR [78195]: 2 g | INTRAVENOUS | @ 05:00:00 | NDC 60505614700

## 2023-01-24 MED ADMIN — METRONIDAZOLE IN NACL (ISO-OS) 500 MG/100 ML IV PGBK [5018]: 500 mg | INTRAVENOUS | @ 21:00:00 | NDC 00409015201

## 2023-01-24 MED ADMIN — VASOPRESSIN 0.2 UNIT/ML IV SOLN [458566]: 1.8 [IU]/h | INTRAVENOUS | @ 21:00:00 | NDC 42023023701

## 2023-01-24 MED ADMIN — PANTOPRAZOLE 40 MG IV SOLR [78621]: 40 mg | INTRAVENOUS | @ 18:00:00 | NDC 55150020200

## 2023-01-24 MED ADMIN — NOREPINEPHRINE BITARTRATE 1 MG/ML IV SOLN [10734]: 0.09 ug/kg/min | INTRAVENOUS | @ 12:00:00 | NDC 51991098399

## 2023-01-24 MED ADMIN — MAGNESIUM SULFATE IN D5W 1 GRAM/100 ML IV PGBK [166578]: 1 g | INTRAVENOUS | @ 10:00:00 | Stop: 2023-01-24 | NDC 00264440054

## 2023-01-24 MED ADMIN — POTASSIUM CHLORIDE IN WATER 10 MEQ/50 ML IV PGBK [11075]: 10 meq | INTRAVENOUS | @ 23:00:00 | Stop: 2023-01-24 | NDC 00338070541

## 2023-01-24 MED ADMIN — POTASSIUM CHLORIDE 20 MEQ/15 ML PO LIQD [6432]: 30 meq | ORAL | @ 03:00:00 | Stop: 2023-01-24 | NDC 50268067411

## 2023-01-24 MED ADMIN — BUMETANIDE 0.25 MG/ML IJ SOLN [9308]: 1.5 mg/h | INTRAVENOUS | @ 15:00:00 | NDC 00641600701

## 2023-01-24 MED ADMIN — HEPARIN, PORCINE (PF) 5,000 UNIT/0.5 ML IJ SYRG [95535]: 5000 [IU] | SUBCUTANEOUS | @ 18:00:00 | NDC 00409131611

## 2023-01-24 MED ADMIN — DEXMEDETOMIDINE IN 0.9 % NACL 400 MCG/100 ML (4 MCG/ML) IV SOLN [317250]: 0.3 ug/kg/h | INTRAVENOUS | @ 18:00:00 | NDC 71225013301

## 2023-01-24 MED ADMIN — POLYETHYLENE GLYCOL 3350 17 GRAM PO PWPK [25424]: 17 g | ORAL | @ 14:00:00 | NDC 00904693186

## 2023-01-25 ENCOUNTER — Encounter: Admit: 2023-01-25 | Discharge: 2023-01-25 | Payer: MEDICARE

## 2023-01-25 ENCOUNTER — Inpatient Hospital Stay: Admit: 2023-01-25 | Discharge: 2023-01-25 | Payer: MEDICARE

## 2023-01-25 MED ADMIN — POTASSIUM CHLORIDE 20 MEQ/15 ML PO LIQD [6432]: 40 meq | OROGASTRIC | @ 08:00:00 | Stop: 2023-01-25 | NDC 50268067411

## 2023-01-25 MED ADMIN — CEFEPIME 2 GRAM IJ SOLR [78195]: 2 g | INTRAVENOUS | @ 18:00:00 | NDC 60505614700

## 2023-01-25 MED ADMIN — SENNOSIDES-DOCUSATE SODIUM 8.6-50 MG PO TAB [40926]: 1 | ORAL | @ 01:00:00 | NDC 00536124810

## 2023-01-25 MED ADMIN — CEFEPIME 2 GRAM IJ SOLR [78195]: 2 g | INTRAVENOUS | @ 05:00:00 | Stop: 2023-01-25 | NDC 60505614700

## 2023-01-25 MED ADMIN — HEPARIN, PORCINE (PF) 5,000 UNIT/0.5 ML IJ SYRG [95535]: 5000 [IU] | SUBCUTANEOUS | @ 02:00:00 | NDC 00409131611

## 2023-01-25 MED ADMIN — SODIUM CHLORIDE 0.9 % IV PGBK (MB+) [95161]: 2 g | INTRAVENOUS | @ 05:00:00 | Stop: 2023-01-25 | NDC 00338915930

## 2023-01-25 MED ADMIN — ACETAZOLAMIDE SODIUM 500 MG IJ SOLR [114]: 500 mg | INTRAVENOUS | @ 20:00:00 | Stop: 2023-01-26 | NDC 23155031331

## 2023-01-25 MED ADMIN — BUMETANIDE 0.25 MG/ML IJ SOLN [9308]: 2 mg | INTRAVENOUS | @ 22:00:00 | NDC 65219057001

## 2023-01-25 MED ADMIN — INSULIN ASPART 100 UNIT/ML SC FLEXPEN [87504]: 1 [IU] | SUBCUTANEOUS | @ 16:00:00 | NDC 00169633910

## 2023-01-25 MED ADMIN — COLCHICINE 0.6 MG PO TAB [1821]: 0.6 mg | ORAL | @ 14:00:00 | NDC 70010000201

## 2023-01-25 MED ADMIN — HYDROCORTISONE SOD SUCC (PF) 100 MG/2 ML IJ SOLR [302326]: 50 mg | INTRAVENOUS | @ 18:00:00 | NDC 00009001103

## 2023-01-25 MED ADMIN — POTASSIUM CHLORIDE 20 MEQ/15 ML PO LIQD [6432]: 60 meq | NASOGASTRIC | @ 22:00:00 | Stop: 2023-01-25 | NDC 63739072401

## 2023-01-25 MED ADMIN — SODIUM CHLORIDE 0.9 % IV PGBK (MB+) [95161]: 2 g | INTRAVENOUS | @ 18:00:00 | NDC 00338915930

## 2023-01-25 MED ADMIN — POLYETHYLENE GLYCOL 3350 17 GRAM PO PWPK [25424]: 17 g | ORAL | @ 14:00:00 | NDC 00904693186

## 2023-01-25 MED ADMIN — METRONIDAZOLE IN NACL (ISO-OS) 500 MG/100 ML IV PGBK [5018]: 500 mg | INTRAVENOUS | @ 05:00:00 | NDC 00409015201

## 2023-01-25 MED ADMIN — METRONIDAZOLE IN NACL (ISO-OS) 500 MG/100 ML IV PGBK [5018]: 500 mg | INTRAVENOUS | @ 14:00:00 | NDC 00409015201

## 2023-01-25 MED ADMIN — METRONIDAZOLE IN NACL (ISO-OS) 500 MG/100 ML IV PGBK [5018]: 500 mg | INTRAVENOUS | @ 22:00:00 | NDC 00409015201

## 2023-01-25 MED ADMIN — MAGNESIUM SULFATE IN D5W 1 GRAM/100 ML IV PGBK [166578]: 1 g | INTRAVENOUS | @ 08:00:00 | Stop: 2023-01-25 | NDC 00264440054

## 2023-01-25 MED ADMIN — POTASSIUM CHLORIDE 20 MEQ/15 ML PO LIQD [6432]: 60 meq | NASOGASTRIC | @ 22:00:00 | Stop: 2023-01-25 | NDC 71656002115

## 2023-01-25 MED ADMIN — HYDROCORTISONE SOD SUCC (PF) 100 MG/2 ML IJ SOLR [302326]: 50 mg | INTRAVENOUS | @ 10:00:00 | NDC 00009001103

## 2023-01-25 MED ADMIN — HYDROCORTISONE SOD SUCC (PF) 100 MG/2 ML IJ SOLR [302326]: 50 mg | INTRAVENOUS | @ 05:00:00 | NDC 00009001103

## 2023-01-25 MED ADMIN — VASOPRESSIN 0.2 UNIT/ML IV SOLN [458566]: 2.4 [IU]/h | INTRAVENOUS | @ 14:00:00 | NDC 42023023701

## 2023-01-25 MED ADMIN — HEPARIN, PORCINE (PF) 5,000 UNIT/0.5 ML IJ SYRG [95535]: 5000 [IU] | SUBCUTANEOUS | @ 10:00:00 | Stop: 2023-01-26 | NDC 00409131611

## 2023-01-25 MED ADMIN — VASOPRESSIN 0.2 UNIT/ML IV SOLN [458566]: 2.4 [IU]/h | INTRAVENOUS | @ 23:00:00 | NDC 42023023701

## 2023-01-25 MED ADMIN — PANTOPRAZOLE 40 MG IV SOLR [78621]: 40 mg | INTRAVENOUS | @ 01:00:00 | NDC 55150020200

## 2023-01-25 MED ADMIN — BUMETANIDE 0.25 MG/ML IJ SOLN [9308]: 0.5 mg/h | INTRAVENOUS | @ 05:00:00 | Stop: 2023-01-25 | NDC 00641600701

## 2023-01-25 MED ADMIN — HYDROCORTISONE SOD SUCC (PF) 100 MG/2 ML IJ SOLR [302326]: 50 mg | INTRAVENOUS | @ 22:00:00 | NDC 00009001103

## 2023-01-25 MED ADMIN — VASOPRESSIN 0.2 UNIT/ML IV SOLN [458566]: 2.4 [IU]/h | INTRAVENOUS | @ 05:00:00 | NDC 42023023701

## 2023-01-25 MED ADMIN — BUMETANIDE 0.25 MG/ML IJ SOLN [9308]: 2 mg | INTRAVENOUS | @ 16:00:00 | NDC 65219057001

## 2023-01-25 MED ADMIN — SENNOSIDES-DOCUSATE SODIUM 8.6-50 MG PO TAB [40926]: 1 | ORAL | @ 14:00:00 | NDC 00536124810

## 2023-01-25 MED ADMIN — PANTOPRAZOLE 40 MG IV SOLR [78621]: 40 mg | INTRAVENOUS | @ 18:00:00 | NDC 55150020200

## 2023-01-25 NOTE — Progress Notes
Report Sheet    TEE          Indication: Atrial Fibrillation Watchman procedure planned need to look at Tricuspid valve  Ordering Provider: Dr. Riley Nearing    Name: Brittany Mccormick     Age: 66 y.o.    DOB: September 17, 1956     MRN: [id number]    RM #: [facility] [id number]  ICU  RN:     Phone #:     Isolation: n/a    Communication Barrier: daughter will consent    NPO Hold TF   A&O Intubated daughter will consent    Travel bedside   IV Levo and vasopressin   O2 intubated   Additional Information: this is planned potentially for 5/7 as 5/6 thoracentesis,    Lab(s) needed: platelets  Other:   Device check needed: Np    Device:   Lab Results   Component Value Date/Time    [employer] 12/01/2022 09:28 AM    EPDEVTYP ICD 12/01/2022 09:28 AM       Prior TEE:      Prior DCCV: several at OSH     Prior Echo:01/22/23   Previous TEE/DCCV details:     EF:   ECHO EF   Date Value Ref Range Status   08/31/2022 27 % Final       Anticoag:heparin     Dose:5,000 unite     Frequency:q8 hr     Last Dose:     Missed:    LAB:   Lab Results   Component Value Date/Time    INR 1.4 (H) 01/22/2023 06:51 PM    PTT 29.8 01/22/2023 06:51 PM    HGB 11.5 (L) 01/25/2023 01:26 AM    PLTCT 63 (L) 01/25/2023 01:26 AM    GLU 233 (H) 01/25/2023 01:26 AM    GLU 91 02/15/2003 05:16 AM    NA 136 (L) 01/25/2023 01:26 AM    K 3.8 01/25/2023 01:26 AM    CR 1.43 (H) 01/25/2023 01:26 AM       Probe   Gastric Surgery    GERD    Swallow difficulty    Head/Neck Surg/Rad    Chest Surg/Rad    EGD    Varices/Esophag CA    GI bleed    Dental issues      Sedation   COPD    OSA/CPAP    Asthma    Pulm HTN    Anesthesia Issues    Smoker    ETOH & Frequency    Drug Use    Chronic Pain Med    GLP-1 Last dose:     Current Medications:   No current outpatient medications on file.       Past Medical/Surgical History:   Patient Active Problem List    Diagnosis Date Noted    Atrial fibrillation (HCC) 01/22/2023    Shortness of breath 08/29/2022    Paroxysmal atrial fibrillation (HCC) 07/21/2022    Anemia 07/20/2022    GI bleed 07/20/2022    Leukocytosis 10/24/2018    NSVT (nonsustained ventricular tachycardia) (HCC) 10/24/2018    AKI (acute kidney injury) (HCC) 10/24/2018    Acute respiratory failure with hypoxia (HCC) 10/22/2018    Acute on chronic systolic heart failure (HCC) 04/07/2018    COPD (chronic obstructive pulmonary disease) (HCC) 04/07/2018    Anxiety 04/07/2018    ICD (implantable cardioverter-defibrillator), dual, 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    Ischemic cardiomyopathy 04/01/2018    History of cardiac arrest with ventricular  fibrillation (HCC) 04/01/2018    Pulmonary edema 03/31/2018    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    Coronary artery disease of native artery of native heart with stable angina pectoris (HCC) 04/24/2009    Hyperlipidemia 04/24/2009    Peripheral vascular disease (HCC) 04/24/2009    History of atrial fibrillation 04/24/2009    Tobacco abuse 04/24/2009      Past Medical History:   Diagnosis Date    Anemia 07/20/2022    Arthritis     CAD (coronary artery disease), native coronary artery 04/24/2009      5/01 - Non-Q MI-transferred to [facility]       5/01 - Cardiac cath: 40% distal LM, 80% narrowing of small DX, 50-60% proximal                  LAD, LCX 30-50% in OM1, 70% in OM2 distal LCX 50-60%, RCA had                  high-grade stenosis with appearance of ruptured plaque in proximal RCA       5/01 - PTCA/stent to RCA.       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    GI bleed 07/20/2022    Gout     Heart attack (HCC)     x4, most recent 03/03/2018    History of atrial fibrillation 04/24/2009    History of tobacco use 04/24/2009    Hyperlipemia     Hyperlipidemia 04/24/2009    Ischemic cardiomyopathy 04/01/2018    Neuropathy - (NOS)     Numbness of toes     On home oxygen therapy     Peripheral vascular disease (HCC) 04/24/2009    Pulmonary artery, stenosis     Respiratory failure, post-operative (HCC) 04/24/2009      Surgical History:   Procedure Laterality Date    INSERTION/ REPLACEMENT IMPLANTABLE DEFIBRILLATOR SYSTEM AND LEAD Left 04/04/2018    Performed by Smith Robert, MD at [facility] EP LAB    FLUOROSCOPY - CARDIAC  04/04/2018    Performed by Smith Robert, MD at [facility] EP LAB    ELECTROPHYSIOLOGIC EVALUATION IMPLANTED PACING CARDIOVERTER/ DEFIBRILLATOR LEADS AT TIME OF IMPLANTATION  04/04/2018    Performed by Smith Robert, MD at [facility] EP LAB    ESOPHAGOGASTRODUODENOSCOPY WITH ENDOSCOPIC ULTRASOUND EXAMINATION - FLEXIBLE N/A 08/28/2019    Performed by Comer Locket, MD at [facility] ENDO    ENDOSCOPIC RETROGRADE CHOLANGIOPANCREATOGRAPHY WITH BIOPSY N/A 08/31/2019    Performed by Comer Locket, MD at [facility] ENDO    [facility] C-SECTION DELIVERY      x2    HX CHOLECYSTECTOMY      HX CORONARY STENT PLACEMENT      last count was 16 stents    HX PTCA      x11    PR CARDIOT EXPL RMVL FB ATR/VENTR THRMB CARD BYP      quadruple bypass and vascular LLE bypass       Allergies:   Allergies   Allergen Reactions    Amoxicillin      Allergy recorded in SMS: Amoxicillin~Reactions: MUSCLE PAIN    Codeine NAUSEA AND VOMITING     Allergy recorded in SMS: Codeine~Reactions: VOMITING    Darvocet [Propoxyphene N-Acetaminophen] RASH and ITCHING    Diphenhydramine Hcl  Allergy recorded in SMS: BENADRYL~Reactions: SLEEP WALKING    Oxycodone RASH and ITCHING    Percocet [Oxycodone-Acetaminophen] RASH and ITCHING    Vicodin [Hydrocodone-Acetaminophen] RASH and ITCHING    Atorvastatin NAUSEA AND VOMITING    Maxidex [Dexamethasone] ITCHING     Glass vein    Lasix [Furosemide] NAUSEA AND VOMITING     Pt states Glass in my veins, makes me itch    Maxzide [Triamterene-Hydrochlorothiazid] SEE COMMENTS     Of note, patient tolerated IV diuril during 08/2022 stay     Jerid Catherman, RN

## 2023-01-26 ENCOUNTER — Inpatient Hospital Stay: Admit: 2023-01-26 | Discharge: 2023-01-26 | Payer: MEDICARE

## 2023-01-26 ENCOUNTER — Encounter: Admit: 2023-01-26 | Discharge: 2023-01-26 | Payer: MEDICARE

## 2023-01-26 DIAGNOSIS — E785 Hyperlipidemia, unspecified: Secondary | ICD-10-CM

## 2023-01-26 DIAGNOSIS — J95821 Acute postprocedural respiratory failure: Secondary | ICD-10-CM

## 2023-01-26 DIAGNOSIS — M109 Gout, unspecified: Secondary | ICD-10-CM

## 2023-01-26 DIAGNOSIS — Z87891 Personal history of nicotine dependence: Secondary | ICD-10-CM

## 2023-01-26 DIAGNOSIS — I255 Ischemic cardiomyopathy: Secondary | ICD-10-CM

## 2023-01-26 DIAGNOSIS — I469 Cardiac arrest, cause unspecified: Secondary | ICD-10-CM

## 2023-01-26 DIAGNOSIS — Z9981 Dependence on supplemental oxygen: Secondary | ICD-10-CM

## 2023-01-26 DIAGNOSIS — I219 Acute myocardial infarction, unspecified: Secondary | ICD-10-CM

## 2023-01-26 DIAGNOSIS — I2510 Atherosclerotic heart disease of native coronary artery without angina pectoris: Secondary | ICD-10-CM

## 2023-01-26 DIAGNOSIS — M1990 Unspecified osteoarthritis, unspecified site: Secondary | ICD-10-CM

## 2023-01-26 DIAGNOSIS — D649 Anemia, unspecified: Secondary | ICD-10-CM

## 2023-01-26 DIAGNOSIS — Z8679 Personal history of other diseases of the circulatory system: Secondary | ICD-10-CM

## 2023-01-26 DIAGNOSIS — R200 Anesthesia of skin: Secondary | ICD-10-CM

## 2023-01-26 DIAGNOSIS — K922 Gastrointestinal hemorrhage, unspecified: Secondary | ICD-10-CM

## 2023-01-26 DIAGNOSIS — I739 Peripheral vascular disease, unspecified: Secondary | ICD-10-CM

## 2023-01-26 DIAGNOSIS — I724 Aneurysm of artery of lower extremity: Secondary | ICD-10-CM

## 2023-01-26 MED ORDER — PROPOFOL 10 MG/ML IV EMUL 50 ML (INFUSION)(AM)(OR)
INTRAVENOUS | 0 refills | Status: DC
Start: 2023-01-26 — End: 2023-01-26
  Administered 2023-01-26: 16:00:00 50 ug/kg/min via INTRAVENOUS

## 2023-01-26 MED ADMIN — POTASSIUM CHLORIDE 20 MEQ/15 ML PO LIQD [6432]: 60 meq | OROGASTRIC | @ 18:00:00 | Stop: 2023-01-26 | NDC 50268067411

## 2023-01-26 MED ADMIN — SODIUM CHLORIDE 0.9 % IV PGBK (MB+) [95161]: 2 g | INTRAVENOUS | @ 05:00:00 | NDC 00338915930

## 2023-01-26 MED ADMIN — POTASSIUM CHLORIDE 20 MEQ/15 ML PO LIQD [6432]: 60 meq | NASOGASTRIC | @ 10:00:00 | Stop: 2023-01-26 | NDC 71656002115

## 2023-01-26 MED ADMIN — VASOPRESSIN 0.2 UNIT/ML IV SOLN [458566]: 2.4 [IU]/h | INTRAVENOUS | @ 05:00:00 | NDC 42023023701

## 2023-01-26 MED ADMIN — NOREPINEPHRINE BITARTRATE 1 MG/ML IV SOLN [10734]: 0.12 ug/kg/min | INTRAVENOUS | @ 05:00:00 | NDC 51991098399

## 2023-01-26 MED ADMIN — POTASSIUM CHLORIDE 20 MEQ PO TBTQ [35943]: 60 meq | ORAL | @ 22:00:00 | Stop: 2023-01-26 | NDC 00832532510

## 2023-01-26 MED ADMIN — HYDROCORTISONE SOD SUCC (PF) 100 MG/2 ML IJ SOLR [302326]: 50 mg | INTRAVENOUS | @ 22:00:00 | NDC 00009001103

## 2023-01-26 MED ADMIN — METRONIDAZOLE IN NACL (ISO-OS) 500 MG/100 ML IV PGBK [5018]: 500 mg | INTRAVENOUS | @ 06:00:00 | Stop: 2023-01-26 | NDC 00409015201

## 2023-01-26 MED ADMIN — WATER FOR INJECTION, STERILE IJ SOLN [79513]: 20 mL | INTRAVENOUS | @ 15:00:00 | Stop: 2023-01-26 | NDC 00409488723

## 2023-01-26 MED ADMIN — VASOPRESSIN 0.2 UNIT/ML IV SOLN [458566]: 2.4 [IU]/h | INTRAVENOUS | @ 22:00:00 | NDC 42023023701

## 2023-01-26 MED ADMIN — HEPARIN, PORCINE (PF) 5,000 UNIT/0.5 ML IJ SYRG [95535]: 5000 [IU] | SUBCUTANEOUS | @ 03:00:00 | Stop: 2023-01-26 | NDC 00409131611

## 2023-01-26 MED ADMIN — ACETAZOLAMIDE SODIUM 500 MG IJ SOLR [114]: 500 mg | INTRAVENOUS | @ 19:00:00 | Stop: 2023-01-27 | NDC 23155031331

## 2023-01-26 MED ADMIN — WATER FOR INJECTION, STERILE IJ SOLN [79513]: 5 mL | INTRAVENOUS | @ 03:00:00 | Stop: 2023-01-26 | NDC 00409488723

## 2023-01-26 MED ADMIN — DEXTROSE 5% IN WATER IV SOLP [2364]: 0.12 ug/kg/min | INTRAVENOUS | @ 05:00:00 | NDC 00338001702

## 2023-01-26 MED ADMIN — PANTOPRAZOLE 40 MG IV SOLR [78621]: 40 mg | INTRAVENOUS | @ 18:00:00 | NDC 55150020200

## 2023-01-26 MED ADMIN — BUMETANIDE 0.25 MG/ML IJ SOLN [9308]: 2 mg | INTRAVENOUS | @ 13:00:00 | Stop: 2023-01-26 | NDC 65219057001

## 2023-01-26 MED ADMIN — VASOPRESSIN 0.2 UNIT/ML IV SOLN [458566]: 2.4 [IU]/h | INTRAVENOUS | @ 13:00:00 | NDC 42023023701

## 2023-01-26 MED ADMIN — COLCHICINE 0.6 MG PO TAB [1821]: 0.6 mg | ORAL | @ 13:00:00 | Stop: 2023-01-26 | NDC 70010000201

## 2023-01-26 MED ADMIN — SENNOSIDES-DOCUSATE SODIUM 8.6-50 MG PO TAB [40926]: 1 | ORAL | @ 13:00:00 | Stop: 2023-01-26 | NDC 00536124810

## 2023-01-26 MED ADMIN — HYDROCORTISONE SOD SUCC (PF) 100 MG/2 ML IJ SOLR [302326]: 50 mg | INTRAVENOUS | @ 11:00:00 | NDC 00009001103

## 2023-01-26 MED ADMIN — PANTOPRAZOLE 40 MG IV SOLR [78621]: 40 mg | INTRAVENOUS | @ 01:00:00 | NDC 55150020200

## 2023-01-26 MED ADMIN — ACETAZOLAMIDE SODIUM 500 MG IJ SOLR [114]: 500 mg | INTRAVENOUS | @ 03:00:00 | Stop: 2023-01-26 | NDC 23155031331

## 2023-01-26 MED ADMIN — ACETAZOLAMIDE SODIUM 500 MG IJ SOLR [114]: 500 mg | INTRAVENOUS | @ 15:00:00 | Stop: 2023-01-27 | NDC 23155031331

## 2023-01-26 MED ADMIN — POTASSIUM CHLORIDE IN WATER 10 MEQ/50 ML IV PGBK [11075]: 10 meq | INTRAVENOUS | @ 10:00:00 | Stop: 2023-01-26 | NDC 00338070541

## 2023-01-26 MED ADMIN — POTASSIUM CHLORIDE 20 MEQ/15 ML PO LIQD [6432]: 60 meq | NASOGASTRIC | @ 10:00:00 | Stop: 2023-01-26 | NDC 50268067411

## 2023-01-26 MED ADMIN — POLYETHYLENE GLYCOL 3350 17 GRAM PO PWPK [25424]: 17 g | ORAL | @ 13:00:00 | Stop: 2023-01-26 | NDC 00904693186

## 2023-01-26 MED ADMIN — MAGNESIUM SULFATE IN D5W 1 GRAM/100 ML IV PGBK [166578]: 1 g | INTRAVENOUS | @ 10:00:00 | Stop: 2023-01-26 | NDC 00264440054

## 2023-01-26 MED ADMIN — METRONIDAZOLE IN NACL (ISO-OS) 500 MG/100 ML IV PGBK [5018]: 500 mg | INTRAVENOUS | @ 13:00:00 | Stop: 2023-01-26 | NDC 00409015201

## 2023-01-26 MED ADMIN — CEFTRIAXONE INJ 2GM IVP [210254]: 2 g | INTRAVENOUS | @ 15:00:00 | NDC 60505614900

## 2023-01-26 MED ADMIN — POTASSIUM CHLORIDE IN WATER 10 MEQ/50 ML IV PGBK [11075]: 10 meq | INTRAVENOUS | @ 12:00:00 | Stop: 2023-01-26 | NDC 00338070541

## 2023-01-26 MED ADMIN — WATER FOR INJECTION, STERILE IJ SOLN [79513]: 20 mL | INTRAVENOUS | @ 18:00:00 | Stop: 2023-01-26 | NDC 00409488723

## 2023-01-26 MED ADMIN — CEFEPIME 2 GRAM IJ SOLR [78195]: 2 g | INTRAVENOUS | @ 05:00:00 | NDC 60505614700

## 2023-01-26 MED ADMIN — HYDROCORTISONE SOD SUCC (PF) 100 MG/2 ML IJ SOLR [302326]: 50 mg | INTRAVENOUS | @ 05:00:00 | NDC 00009001103

## 2023-01-26 MED ADMIN — POTASSIUM CHLORIDE 20 MEQ/15 ML PO LIQD [6432]: 60 meq | NASOGASTRIC | @ 10:00:00 | Stop: 2023-01-26 | NDC 63739072401

## 2023-01-26 MED ADMIN — SENNOSIDES-DOCUSATE SODIUM 8.6-50 MG PO TAB [40926]: 1 | ORAL | @ 01:00:00 | NDC 00536124810

## 2023-01-26 MED ADMIN — HYDROCORTISONE SOD SUCC (PF) 100 MG/2 ML IJ SOLR [302326]: 50 mg | INTRAVENOUS | @ 19:00:00 | NDC 00009001103

## 2023-01-26 NOTE — Anesthesia Post-Procedure Evaluation
Post-Anesthesia Evaluation    Name: Brittany Mccormick      MRN: [id number]     DOB: [birth year]     Age: 67 y.o.     Sex: female   __________________________________________________________________________     Procedure Information       Anesthesia Start Date/Time: 01/26/23 1037    Scheduled providers: Elige Radon, RN    Procedure: TRANSESOPHAGEAL ECHO    Location: Cardiovascular Medicine: [facility]            Post-Anesthesia Vitals  ABP: 119/55 (05/07 1127)   Vitals Value Taken Time   BP     Temp     Pulse 73 01/26/23 1134   Respirations 24 PER MINUTE 01/26/23 1134   SpO2 95 % 01/26/23 1134   O2 Device     ABP 119/55 01/26/23 1127   ART BP     Vitals shown include unfiled device data.      Post Anesthesia Evaluation Note    Evaluation location: ICU  Patient participation: patient intubated, unable to assess; expectation of recovery by ICU physician  Level of consciousness: intubated & sedated    Ventilator settings  Mode: VC  FIO2: 40  Tidal Volume: 370  Vent rate: 24  PEEP: 5    Pain score: 0  Pain management: adequate    Hydration: normovolemia  Temperature: 36.0?C - 38.4?C  Airway patency: adequate    Perioperative Events       Post-op nausea and vomiting: no PONV    Postoperative Status  Cardiovascular status: hemodynamically stable  Respiratory status: ETT  ICU Information  VasoactiveDrips:norepinephrine infusion and vasopressin  Blood Products Given-no                      Perioperative Events  There were no known complications for this encounter.

## 2023-01-26 NOTE — Anesthesia Pre-Procedure Evaluation
Anesthesia Pre-Procedure Evaluation    Name: Brittany Mccormick      MRN: [id number]     DOB: 06/07/1956     Age: 67 y.o.     Sex: female   _________________________________________________________________________     Procedure Date:    Procedure: * No surgery found *     Physical Assessment  Vital Signs (last filed in past 24 hours):  ABP: 116/48 (05/07 1000)  Temp: 37.2 ?C (99 ?F) (05/07 0800)  Pulse: 80 (05/07 1000)  Respirations: 21 PER MINUTE (05/07 1000)  SpO2: 97 % (05/07 1000)  O2%: 40 % (05/07 1000)  O2 Device: Ventilator (05/07 1000)  Weight: 52.9 kg (116 lb 10 oz) (05/07 0500)      Patient History  Allergies   Allergen Reactions    Amoxicillin      Allergy recorded in SMS: Amoxicillin~Reactions: MUSCLE PAIN    Codeine NAUSEA AND VOMITING     Allergy recorded in SMS: Codeine~Reactions: VOMITING    Darvocet [Propoxyphene N-Acetaminophen] RASH and ITCHING    Diphenhydramine Hcl      Allergy recorded in SMS: BENADRYL~Reactions: SLEEP WALKING    Oxycodone RASH and ITCHING    Percocet [Oxycodone-Acetaminophen] RASH and ITCHING    Vicodin [Hydrocodone-Acetaminophen] RASH and ITCHING    Maxidex [Dexamethasone] ITCHING     Glass vein    Atorvastatin NAUSEA AND VOMITING     Listed as PTA med as of 01/26/23    Lasix [Furosemide] NAUSEA AND VOMITING     Pt states Glass in my veins, makes me itch    Maxzide [Triamterene-Hydrochlorothiazid] SEE COMMENTS     Of note, patient tolerated IV diuril during 08/2022 stay        Current Medications    Medication Directions   acetaminophen (TYLENOL) 325 mg tablet Take one tablet by mouth every 4 hours as needed for Pain.   albuterol (VENTOLIN HFA, PROAIR HFA) 90 mcg/actuation inhaler Inhale one puff to two puffs by mouth into the lungs as Needed for Wheezing.   albuterol-ipratropium (DUONEB) 0.5 mg-3 mg(2.5 mg base)/3 mL nebulizer solution Inhale 3 mL solution by nebulizer as directed every 4 hours as needed for Wheezing.   allopurinoL (ZYLOPRIM) 100 mg tablet Take one tablet by mouth daily. Take with food.   Aspirin 81 mg Tab Take one tablet by mouth daily.   atorvastatin (LIPITOR) 40 mg tablet Take one tablet by mouth daily.   bumetanide (BUMEX) 2 mg tablet Take one tablet by mouth daily.   clopiDOGrel (PLAVIX) 75 mg tablet Take one tablet by mouth daily.   colchicine (COLCRYS) 0.6 mg tablet Take one tablet by mouth twice daily.   diclofenac sodium (VOLTAREN) 1 % topical gel Apply four g topically to affected area four times daily.   ferrous sulfate (FEOSOL) 325 mg (65 mg iron) tablet Take one tablet by mouth daily. Take on an empty stomach at least 1 hour before or 2 hours after food.   FLUoxetine (PROZAC) 10 mg capsule Take one capsule by mouth at bedtime daily.   fluticasone propionate (FLONASE) 50 mcg/actuation nasal spray, suspension Apply two sprays to each nostril as directed daily as needed.   fluticasone-umeclidin-vilanter (TRELEGY ELLIPTA) 100-62.5-25 mcg inhaler Inhale one puff by mouth into the lungs daily.   hydrOXYzine HCL (ATARAX) 25 mg tablet Take one tablet by mouth as Needed.  Patient not taking: Reported on 01/26/2023   metoprolol succinate XL (TOPROL XL) 25 mg extended release tablet Take one tablet by mouth at bedtime daily.  Patient taking differently: Take one tablet by mouth daily.   pantoprazole DR (PROTONIX) 40 mg tablet Take one tablet by mouth twice daily.   potassium chloride SR (K-DUR) 20 mEq tablet Take one tablet by mouth daily. Take with a meal and a full glass of water. **Take one tablet by mouth twice daily for three days, then resume previous dose.**   pregabalin (LYRICA) 50 mg capsule Take one capsule by mouth twice daily.   spironolactone (ALDACTONE) 25 mg tablet Take one tablet by mouth daily. Take with food.         Review of Systems/Medical History      Patient summary reviewed  Pertinent labs reviewed    PONV Screening: Non-smoker and Female sex    No history of anesthetic complications    No family history of anesthetic complications      Airway - negative        Pulmonary       Smoker:  quit 1 month ago, smoked 1 ppd for ~ 30 years.        COPD ( uses inhalers as neeeded, states she is supposed to use them everyday)       Pneumonia        No recent URI      Shortness of breath      Home oxygen use ( 2L )      Intubated with PNA      Cardiovascular       Recent diagnostic studies:          echocardiogram      Exercise tolerance: <4 METS      Beta Blocker therapy: No      Beta blockers within 24 hours: No       : pacemaker and AICD          Manufacture: [employer]                                        CIED interrogated last 12 months (07/13/19)                                  Magnet response: Defibrillator disabled                Hypertension          Valvular problems/murmurs:          Past MI ( 2015, 2019), > 6 months    Coronary artery disease      Coronary artery bypass graft ( 4 vessel, 2003)        PTCA ( x8)       Angina: daily.      PVD ( s/p several stents and lower extremity bypass)      CHF      Hyperlipidemia      Orthopnea      Dyspnea on exertion      Pacemaker place 03/2018.       GI/Hepatic/Renal             No GERD        Liver disease:       cirrhosis       No renal disease:           Neuro/Psych       No seizures  No hx TIA        No CVA      Neuropathy      Musculoskeletal         Arthritis:         Endocrine/Other       No diabetes        No hypothyroidism      No hyperthyroidism      Anemia        Adrenal insufficiency      Constitution - negative       Physical Exam    Airway Findings      Mallampati: II      TM distance: >3 FB      Neck ROM: full      Mouth opening: good      Airway patency: adequate      Pre-existing airway: ETT    Dental Findings:       Upper dentures, lower dentures and full    Cardiovascular Findings:       Rhythm: regular      Rate: normal      Other findings: Murmur    Pulmonary Findings:    Decreased breath sounds.    Abdominal Findings: Negative        Abdomen soft    Neurological Findings: Altered mental status    Constitutional findings: Negative      No acute distress       Diagnostic Tests  Hematology:   Lab Results   Component Value Date    HGB 11.3 01/26/2023    HCT 34.1 01/26/2023    PLTCT 72 01/26/2023    WBC 17.5 01/26/2023    NEUT 75 01/24/2023    ANC 16.42 01/24/2023    LYMPH 12 07/14/2006    ALC 2.04 01/24/2023    MONA 16 01/24/2023    AMC 3.42 01/24/2023    EOSA 0 01/24/2023    ABC 0.06 01/24/2023    MCV 91.8 01/26/2023    MCH 30.4 01/26/2023    MCHC 33.1 01/26/2023    MPV 13.8 01/26/2023    RDW 16.2 01/26/2023         General Chemistry:   Lab Results   Component Value Date    NA 138 01/26/2023    K 3.3 01/26/2023    CL 90 01/26/2023    CO2 33 01/26/2023    GAP 15 01/26/2023    BUN 46 01/26/2023    CR 1.45 01/26/2023    GLU 170 01/26/2023    GLU 91 02/15/2003    CA 8.9 01/26/2023    ALBUMIN 3.5 01/22/2023    LACTIC 1.5 01/24/2023    OBSCA Cancelled by lab   specimen never released 07/12/2006    MG 1.9 01/26/2023    TOTBILI 1.1 01/22/2023    PO4 2.7 01/23/2023      Coagulation:   Lab Results   Component Value Date    PT 15.4 01/22/2023    PTT 29.8 01/22/2023    INR 1.4 01/22/2023         Anesthesia Plan    ASA score: 3   Plan: general  Induction method: intravenous  NPO status: acceptable      Informed Consent  Plan/risks discussed with: Daughter.        Plan discussed with: anesthesiologist, resident and surgeon/proceduralist.  Comments: (Discussed on phone with daughter. Patient already intubated on 2 pressors. All questions addressed. )    PAC Plan  Alerts

## 2023-01-27 ENCOUNTER — Inpatient Hospital Stay: Admit: 2023-01-27 | Discharge: 2023-01-27 | Payer: MEDICARE

## 2023-01-27 MED ADMIN — COLCHICINE 0.6 MG PO TAB [1821]: 0.6 mg | OROGASTRIC | @ 13:00:00 | Stop: 2023-01-27 | NDC 70010000201

## 2023-01-27 MED ADMIN — NALOXONE 0.4 MG/ML IJ SOLN [5373]: 0.4 mg | INTRAVENOUS | @ 19:00:00 | Stop: 2023-01-27 | NDC 70069007101

## 2023-01-27 MED ADMIN — HEPARIN, PORCINE (PF) 5,000 UNIT/0.5 ML IJ SYRG [95535]: 5000 [IU] | SUBCUTANEOUS | @ 19:00:00 | NDC 00409131611

## 2023-01-27 MED ADMIN — PANTOPRAZOLE 40 MG IV SOLR [78621]: 40 mg | INTRAVENOUS | @ 01:00:00 | NDC 55150020200

## 2023-01-27 MED ADMIN — POTASSIUM CHLORIDE 20 MEQ/15 ML PO LIQD [6432]: 60 meq | NASOGASTRIC | @ 01:00:00 | Stop: 2023-01-27 | NDC 63739072401

## 2023-01-27 MED ADMIN — HYDROCORTISONE SOD SUCC (PF) 100 MG/2 ML IJ SOLR [302326]: 50 mg | INTRAVENOUS | @ 17:00:00 | NDC 00009001103

## 2023-01-27 MED ADMIN — CEFTRIAXONE INJ 2GM IVP [210254]: 2 g | INTRAVENOUS | @ 13:00:00 | NDC 60505614900

## 2023-01-27 MED ADMIN — VASOPRESSIN 0.2 UNIT/ML IV SOLN [458566]: 2.4 [IU]/h | INTRAVENOUS | @ 23:00:00 | NDC 42023023701

## 2023-01-27 MED ADMIN — WATER FOR INJECTION, STERILE IJ SOLN [79513]: 5 mL | INTRAVENOUS | @ 03:00:00 | Stop: 2023-01-27 | NDC 00409488723

## 2023-01-27 MED ADMIN — POTASSIUM CHLORIDE 20 MEQ/15 ML PO LIQD [6432]: 60 meq | NASOGASTRIC | @ 01:00:00 | Stop: 2023-01-27 | NDC 50268067411

## 2023-01-27 MED ADMIN — HEPARIN, PORCINE (PF) 5,000 UNIT/0.5 ML IJ SYRG [95535]: 5000 [IU] | SUBCUTANEOUS | @ 03:00:00 | NDC 00409131611

## 2023-01-27 MED ADMIN — POTASSIUM CHLORIDE 20 MEQ/15 ML PO LIQD [6432]: 60 meq | NASOGASTRIC | @ 20:00:00 | Stop: 2023-01-27 | NDC 63739072401

## 2023-01-27 MED ADMIN — MELATONIN 3 MG PO TAB [16830]: 3 mg | NASOGASTRIC | @ 01:00:00 | NDC 50268052411

## 2023-01-27 MED ADMIN — ATORVASTATIN 40 MG PO TAB [77113]: 40 mg | OROGASTRIC | @ 13:00:00 | NDC 00904629261

## 2023-01-27 MED ADMIN — LACTATED RINGERS IV SOLP [4318]: 500 mL | INTRAVENOUS | @ 20:00:00 | Stop: 2023-01-27 | NDC 00338011704

## 2023-01-27 MED ADMIN — HYDROCORTISONE SOD SUCC (PF) 100 MG/2 ML IJ SOLR [302326]: 50 mg | INTRAVENOUS | @ 23:00:00 | NDC 00009001103

## 2023-01-27 MED ADMIN — ACETAZOLAMIDE SODIUM 500 MG IJ SOLR [114]: 500 mg | INTRAVENOUS | @ 03:00:00 | Stop: 2023-01-27 | NDC 23155031331

## 2023-01-27 MED ADMIN — SENNOSIDES-DOCUSATE SODIUM 8.6-50 MG PO TAB [40926]: 1 | OROGASTRIC | @ 01:00:00 | NDC 00536124810

## 2023-01-27 MED ADMIN — POTASSIUM CHLORIDE 20 MEQ/15 ML PO LIQD [6432]: 60 meq | NASOGASTRIC | @ 01:00:00 | Stop: 2023-01-27 | NDC 71656002115

## 2023-01-27 MED ADMIN — HYDROCORTISONE SOD SUCC (PF) 100 MG/2 ML IJ SOLR [302326]: 50 mg | INTRAVENOUS | @ 05:00:00 | NDC 00009001103

## 2023-01-27 MED ADMIN — HEPARIN, PORCINE (PF) 5,000 UNIT/0.5 ML IJ SYRG [95535]: 5000 [IU] | SUBCUTANEOUS | @ 11:00:00 | NDC 00409131611

## 2023-01-27 MED ADMIN — VASOPRESSIN 0.2 UNIT/ML IV SOLN [458566]: 2.4 [IU]/h | INTRAVENOUS | @ 05:00:00 | NDC 42023023701

## 2023-01-27 MED ADMIN — HYDROCORTISONE SOD SUCC (PF) 100 MG/2 ML IJ SOLR [302326]: 50 mg | INTRAVENOUS | @ 11:00:00 | NDC 00009001103

## 2023-01-27 MED ADMIN — FOLIC ACID 1 MG PO TAB [3233]: 1 mg | OROGASTRIC | @ 13:00:00 | NDC 69315012710

## 2023-01-27 MED ADMIN — VASOPRESSIN 0.2 UNIT/ML IV SOLN [458566]: 2.4 [IU]/h | INTRAVENOUS | @ 15:00:00 | NDC 42023023701

## 2023-01-27 MED ADMIN — PANTOPRAZOLE 40 MG IV SOLR [78621]: 40 mg | INTRAVENOUS | @ 14:00:00 | NDC 55150020200

## 2023-01-27 MED ADMIN — FLUOXETINE 10 MG PO CAP [10069]: 10 mg | OROGASTRIC | @ 13:00:00 | NDC 00904578461

## 2023-01-28 ENCOUNTER — Encounter: Admit: 2023-01-28 | Discharge: 2023-01-28 | Payer: MEDICARE

## 2023-01-28 MED ADMIN — COLCHICINE 0.6 MG PO TAB [1821]: 0.3 mg | OROGASTRIC | @ 13:00:00 | NDC 70010000201

## 2023-01-28 MED ADMIN — ATORVASTATIN 40 MG PO TAB [77113]: 40 mg | OROGASTRIC | @ 13:00:00 | NDC 00904629261

## 2023-01-28 MED ADMIN — FOLIC ACID 1 MG PO TAB [3233]: 1 mg | OROGASTRIC | @ 13:00:00 | NDC 00904722461

## 2023-01-28 MED ADMIN — PANTOPRAZOLE 40 MG IV SOLR [78621]: 40 mg | INTRAVENOUS | @ 13:00:00 | NDC 55150020200

## 2023-01-28 MED ADMIN — HYDROCORTISONE SOD SUCC (PF) 100 MG/2 ML IJ SOLR [302326]: 50 mg | INTRAVENOUS | @ 05:00:00 | Stop: 2023-01-28 | NDC 00009001103

## 2023-01-28 MED ADMIN — MELATONIN 3 MG PO TAB [16830]: 3 mg | NASOGASTRIC | @ 02:00:00 | NDC 50268052411

## 2023-01-28 MED ADMIN — SODIUM CHLORIDE 0.9 % IV SOLP [27838]: 500 mL | INTRAVENOUS | @ 15:00:00 | Stop: 2023-01-28 | NDC 00338004904

## 2023-01-28 MED ADMIN — POTASSIUM CHLORIDE 20 MEQ/15 ML PO LIQD [6432]: 60 meq | NASOGASTRIC | @ 02:00:00 | Stop: 2023-01-28 | NDC 50268067411

## 2023-01-28 MED ADMIN — QUETIAPINE 25 MG PO TAB [76945]: 12.5 mg | NASOGASTRIC | @ 02:00:00 | NDC 00904663861

## 2023-01-28 MED ADMIN — HEPARIN, PORCINE (PF) 5,000 UNIT/0.5 ML IJ SYRG [95535]: 5000 [IU] | SUBCUTANEOUS | @ 11:00:00 | NDC 00409131611

## 2023-01-28 MED ADMIN — HYDROCORTISONE SOD SUCC (PF) 100 MG/2 ML IJ SOLR [302326]: 50 mg | INTRAVENOUS | @ 18:00:00 | NDC 00009001103

## 2023-01-28 MED ADMIN — SODIUM ZIRCONIUM CYCLOSILICATE 10 GRAM PO PWPK [337243]: 10 g | OROGASTRIC | @ 11:00:00 | Stop: 2023-01-28 | NDC 00310111001

## 2023-01-28 MED ADMIN — SODIUM CHLORIDE 0.9 % IV SOLP [27838]: 750 mL | INTRAVENOUS | @ 21:00:00 | Stop: 2023-01-28 | NDC 00338004904

## 2023-01-28 MED ADMIN — VASOPRESSIN 0.2 UNIT/ML IV SOLN [458566]: 2.4 [IU]/h | INTRAVENOUS | @ 07:00:00 | Stop: 2023-01-28 | NDC 42023023701

## 2023-01-28 MED ADMIN — LACTATED RINGERS IV SOLP [4318]: 500 mL | INTRAVENOUS | @ 04:00:00 | Stop: 2023-01-28 | NDC 00338011704

## 2023-01-28 MED ADMIN — HYDROCORTISONE SOD SUCC (PF) 100 MG/2 ML IJ SOLR [302326]: 50 mg | INTRAVENOUS | @ 11:00:00 | Stop: 2023-01-28 | NDC 00009001103

## 2023-01-28 MED ADMIN — PANTOPRAZOLE 40 MG IV SOLR [78621]: 40 mg | INTRAVENOUS | @ 02:00:00 | NDC 55150020200

## 2023-01-28 MED ADMIN — CEFTRIAXONE INJ 2GM IVP [210254]: 2 g | INTRAVENOUS | @ 13:00:00 | NDC 60505614900

## 2023-01-28 MED ADMIN — FLUOXETINE 10 MG PO CAP [10069]: 10 mg | OROGASTRIC | @ 13:00:00 | NDC 00904578461

## 2023-01-28 MED ADMIN — VASOPRESSIN 0.2 UNIT/ML IV SOLN [458566]: 2.4 [IU]/h | INTRAVENOUS | @ 17:00:00 | Stop: 2023-01-28 | NDC 42023023701

## 2023-01-28 MED ADMIN — HEPARIN, PORCINE (PF) 5,000 UNIT/0.5 ML IJ SYRG [95535]: 5000 [IU] | SUBCUTANEOUS | @ 02:00:00 | NDC 00409131611

## 2023-01-28 NOTE — Progress Notes
I have reviewed the  chest x ray. The system is absent of lead extenders, lead adapters, broken leads, permanent epicardial leads, and abandoned leads. I agree with the documented recommendations. Proceed with MRI scheduling.

## 2023-01-28 NOTE — Progress Notes
[facility]  MRI Recommendations for [place] [facility] Device Patient Conditional MRI     Patient: Brittany Mccormick  Date of Birth: [birth year]  MRN: [id number]    MRI SYSTEM CONDITIONS    Patient has a complete MRI ready system with MR Conditional generator and lead(s) Leads implanted > 6 weeks prior to MRI:                         [x]   Yes    []  No      Device FDA approved to undergo MRI Scan at:  [x]  1.5T    [x]  3T     DEVICE IMAGING      Patient has current chest imaging that meets policy criteria:    [x]   Yes.    []  No. Chest imaging needed to proceed.    Generator is implanted in the pectoral region: [x]   Yes / Leadless []  No      DEVICE EVALUATION      []   Patient meets eligibility for SmartSync SureScan.  This section does NOT need completed; the application will collect relevant device data at the time of the MRI.    []  Patient has an S-ICD. This section does NOT need completed.     Generator has sufficient battery (generator is not at end-of-life/ ERI/ EOS/ RRT):  [x]   Yes   []  No      Patient is historically NOT dependent (as defined by no escape greater than 40 bpm):  [x]   Correct   []  Incorrect   Lead impedance measurements are within the programmed lead impedance limits (For atrial and ventricular pacing leads: lead impedance 200-2,000 ohms.  For defibrillation leads: lead impedance 20-200 ohms): [x]   Yes   []  No      Patient tolerates a pacing output of 5.0 V at a pulse width of 1.0 ms []   Yes [x]  Assess at time of MRI   Pacing Percentages:   [x]  Atrial Pacing:   23%              [x]  Ventricular Pacing:   2%            Capture threshold < 3.5 V at a pulse width of 0.5 ms for RA and RV leads for devices that will be programmed to an asynchronous pacing mode [x]   Yes    []  No      Preliminary recommendations for PRE-MRI SCAN DEVICE SETTINGS*  (Full assessment of programming to be confirmed by device staff or representative at time of MRI)   Pacing rate selected to avoid competitive pacing (recommendations based on historical data). Tachy detections will be turned off for MRI via MRI mode programmed setting:    Pacing Mode ODO  Therapies OFF                  Images Reviewed and Recommendations Made in Collaboration with:  Routed to Provider In-Basket for Review (see result note for sign off) Date: 01/28/23     Assessment completed by: Anner Crete, RN    *The [facility] Device Team has completed this assessment based on current available data in the patient's medical record.  If changes/ concerns are noted at the time of the MRI, the [facility] Device Staff/ Vendor Representative have the discretion to use best clinical judgement to ensure the safety of the patient.      [employer] SmartSync application has safety parameters in place.  If progression to positive SureScan meets lockout criteria, please contact the [place] Device Team or [employer] representative.  Please send report of final programming parameters sent to [facility] device team (Email: [email or web address] Fax: [phone])      [facility]    [email or web address]  [employer]     [email or web address]    [employer]     [phone]  [email or web address]

## 2023-01-29 ENCOUNTER — Inpatient Hospital Stay: Admit: 2023-01-29 | Discharge: 2023-01-29 | Payer: MEDICARE

## 2023-01-29 MED ADMIN — HYDROCORTISONE SOD SUCC (PF) 100 MG/2 ML IJ SOLR [302326]: 50 mg | INTRAVENOUS | @ 20:00:00 | NDC 00009001103

## 2023-01-29 MED ADMIN — HYDROCORTISONE SOD SUCC (PF) 100 MG/2 ML IJ SOLR [302326]: 50 mg | INTRAVENOUS | @ 02:00:00 | NDC 00009001103

## 2023-01-29 MED ADMIN — POTASSIUM CHLORIDE 20 MEQ/15 ML PO LIQD [6432]: 40 meq | NASOGASTRIC | @ 12:00:00 | Stop: 2023-01-29 | NDC 71656002115

## 2023-01-29 MED ADMIN — DEXTROSE 5% IN WATER IV SOLP [2364]: 0.1 ug/kg/min | INTRAVENOUS | @ 09:00:00 | NDC 00338001702

## 2023-01-29 MED ADMIN — COLCHICINE 0.6 MG PO TAB [1821]: 0.3 mg | OROGASTRIC | @ 14:00:00 | NDC 70010000201

## 2023-01-29 MED ADMIN — ATORVASTATIN 40 MG PO TAB [77113]: 40 mg | OROGASTRIC | @ 14:00:00 | NDC 00904629261

## 2023-01-29 MED ADMIN — HYDROCORTISONE SOD SUCC (PF) 100 MG/2 ML IJ SOLR [302326]: 50 mg | INTRAVENOUS | @ 11:00:00 | NDC 00009001103

## 2023-01-29 MED ADMIN — FLUOXETINE 10 MG PO CAP [10069]: 10 mg | OROGASTRIC | @ 14:00:00 | NDC 00904578461

## 2023-01-29 MED ADMIN — PANTOPRAZOLE 40 MG IV SOLR [78621]: 40 mg | INTRAVENOUS | @ 02:00:00 | NDC 55150020200

## 2023-01-29 MED ADMIN — PANTOPRAZOLE 40 MG IV SOLR [78621]: 40 mg | INTRAVENOUS | @ 16:00:00 | NDC 55150020200

## 2023-01-29 MED ADMIN — SODIUM CHLORIDE 0.65 % NA SPRA [29676]: 2 | NASAL | @ 11:00:00 | NDC 00904386575

## 2023-01-29 MED ADMIN — METRONIDAZOLE IN NACL (ISO-OS) 500 MG/100 ML IV PGBK [5018]: 500 mg | INTRAVENOUS | @ 14:00:00 | NDC 00409015201

## 2023-01-29 MED ADMIN — FOLIC ACID 1 MG PO TAB [3233]: 1 mg | OROGASTRIC | @ 14:00:00 | NDC 00904722461

## 2023-01-29 MED ADMIN — SODIUM CHLORIDE 0.9 % IV PGBK (MB+) [95161]: 2 g | INTRAVENOUS | @ 14:00:00 | Stop: 2023-01-29 | NDC 00338915930

## 2023-01-29 MED ADMIN — AMIODARONE IN DEXTROSE,ISO-OSM 360 MG/200 ML (1.8 MG/ML) IV SOLN [307603]: 1 mg/min | INTRAVENOUS | @ 23:00:00 | Stop: 2023-01-30 | NDC 43066036020

## 2023-01-29 MED ADMIN — METOPROLOL TARTRATE 5 MG/5 ML IV SOLN [5007]: 5 mg | INTRAVENOUS | @ 03:00:00 | Stop: 2023-01-29 | NDC 72611074001

## 2023-01-29 MED ADMIN — CEFEPIME 2 GRAM IJ SOLR [78195]: 2 g | INTRAVENOUS | @ 14:00:00 | Stop: 2023-01-29 | NDC 60505614700

## 2023-01-29 MED ADMIN — SODIUM CHLORIDE 0.9 % IV SOLP [27838]: 500 mL | INTRAVENOUS | @ 05:00:00 | Stop: 2023-01-29 | NDC 00338004903

## 2023-01-29 MED ADMIN — QUETIAPINE 25 MG PO TAB [76945]: 12.5 mg | NASOGASTRIC | @ 02:00:00 | NDC 00904663861

## 2023-01-29 MED ADMIN — POTASSIUM CHLORIDE 20 MEQ/15 ML PO LIQD [6432]: 40 meq | NASOGASTRIC | @ 12:00:00 | Stop: 2023-01-29 | NDC 50268067411

## 2023-01-29 MED ADMIN — MODAFINIL 100 MG PO TAB [83007]: 100 mg | ORAL | @ 20:00:00 | NDC 00904679104

## 2023-01-29 MED ADMIN — MELATONIN 3 MG PO TAB [16830]: 3 mg | NASOGASTRIC | @ 02:00:00 | NDC 20555003600

## 2023-01-29 MED ADMIN — AMIODARONE IN DEXTROSE,ISO-OSM 150 MG/100 ML (1.5 MG/ML) IV SOLN [307602]: 150 mg | INTRAVENOUS | @ 23:00:00 | Stop: 2023-01-29 | NDC 43066015010

## 2023-01-29 MED ADMIN — LINEZOLID IN DEXTROSE 5% 600 MG/300 ML IV PGBK [86283]: 600 mg | INTRAVENOUS | @ 16:00:00 | NDC 55150024251

## 2023-01-29 MED ADMIN — NOREPINEPHRINE BITARTRATE 1 MG/ML IV SOLN [10734]: 0.1 ug/kg/min | INTRAVENOUS | @ 09:00:00 | NDC 51991098399

## 2023-01-29 MED ADMIN — ONDANSETRON HCL (PF) 4 MG/2 ML IJ SOLN [136012]: 4 mg | INTRAVENOUS | @ 18:00:00 | Stop: 2023-01-29 | NDC 00641607801

## 2023-01-30 ENCOUNTER — Inpatient Hospital Stay: Admit: 2023-01-30 | Discharge: 2023-01-30 | Payer: MEDICARE

## 2023-01-30 MED ADMIN — COLCHICINE 0.6 MG PO TAB [1821]: 0.3 mg | OROGASTRIC | @ 13:00:00 | NDC 70010000201

## 2023-01-30 MED ADMIN — SODIUM CHLORIDE 0.9 % IV PGBK (MB+) [95161]: 2 g | INTRAVENOUS | @ 15:00:00 | NDC 00338915930

## 2023-01-30 MED ADMIN — ATORVASTATIN 40 MG PO TAB [77113]: 40 mg | OROGASTRIC | @ 13:00:00 | NDC 00904629261

## 2023-01-30 MED ADMIN — VASOPRESSIN 0.2 UNIT/ML IV SOLN [458566]: 0.6 [IU]/h | INTRAVENOUS | @ 12:00:00 | NDC 42023023701

## 2023-01-30 MED ADMIN — VASOPRESSIN 0.2 UNIT/ML IV SOLN [458566]: 1.6 [IU]/h | INTRAVENOUS | @ 03:00:00 | NDC 42023023701

## 2023-01-30 MED ADMIN — FOLIC ACID 1 MG PO TAB [3233]: 1 mg | OROGASTRIC | @ 13:00:00 | NDC 00904722461

## 2023-01-30 MED ADMIN — POTASSIUM CHLORIDE 20 MEQ/15 ML PO LIQD [6432]: 60 meq | OROGASTRIC | @ 10:00:00 | Stop: 2023-01-30 | NDC 71656002115

## 2023-01-30 MED ADMIN — HYDROCORTISONE SOD SUCC (PF) 100 MG/2 ML IJ SOLR [302326]: 50 mg | INTRAVENOUS | @ 04:00:00 | NDC 00009001103

## 2023-01-30 MED ADMIN — BUMETANIDE 0.25 MG/ML IJ SOLN [9308]: 2 mg | INTRAVENOUS | @ 14:00:00 | Stop: 2023-01-30 | NDC 72205010101

## 2023-01-30 MED ADMIN — SODIUM CHLORIDE 0.9 % IV PGBK (MB+) [95161]: 2 g | INTRAVENOUS | @ 02:00:00 | NDC 00338915930

## 2023-01-30 MED ADMIN — HYDROCORTISONE SOD SUCC (PF) 100 MG/2 ML IJ SOLR [302326]: 50 mg | INTRAVENOUS | @ 10:00:00 | Stop: 2023-01-30 | NDC 00009001103

## 2023-01-30 MED ADMIN — CEFEPIME 2 GRAM IJ SOLR [78195]: 2 g | INTRAVENOUS | @ 15:00:00 | NDC 60505614700

## 2023-01-30 MED ADMIN — PANTOPRAZOLE 40 MG IV SOLR [78621]: 40 mg | INTRAVENOUS | @ 17:00:00 | NDC 55150020200

## 2023-01-30 MED ADMIN — AMIODARONE IN DEXTROSE,ISO-OSM 360 MG/200 ML (1.8 MG/ML) IV SOLN [307603]: 0.5 mg/min | INTRAVENOUS | @ 03:00:00 | NDC 43066036020

## 2023-01-30 MED ADMIN — AMIODARONE IN DEXTROSE,ISO-OSM 360 MG/200 ML (1.8 MG/ML) IV SOLN [307603]: 0.5 mg/min | INTRAVENOUS | @ 22:00:00 | NDC 43066036020

## 2023-01-30 MED ADMIN — MODAFINIL 100 MG PO TAB [83007]: 100 mg | ORAL | @ 14:00:00 | NDC 00904679104

## 2023-01-30 MED ADMIN — MELATONIN 3 MG PO TAB [16830]: 3 mg | NASOGASTRIC | @ 02:00:00 | NDC 20555003600

## 2023-01-30 MED ADMIN — METRONIDAZOLE IN NACL (ISO-OS) 500 MG/100 ML IV PGBK [5018]: 500 mg | INTRAVENOUS | @ 13:00:00 | NDC 00409015201

## 2023-01-30 MED ADMIN — CEFEPIME 2 GRAM IJ SOLR [78195]: 2 g | INTRAVENOUS | @ 02:00:00 | NDC 60505614700

## 2023-01-30 MED ADMIN — VASOPRESSIN 0.2 UNIT/ML IV SOLN [458566]: 2.4 [IU]/h | INTRAVENOUS | @ 22:00:00 | NDC 42023023701

## 2023-01-30 MED ADMIN — LINEZOLID IN DEXTROSE 5% 600 MG/300 ML IV PGBK [86283]: 600 mg | INTRAVENOUS | @ 16:00:00 | NDC 55150024251

## 2023-01-30 MED ADMIN — LINEZOLID IN DEXTROSE 5% 600 MG/300 ML IV PGBK [86283]: 600 mg | INTRAVENOUS | @ 04:00:00 | NDC 66794021963

## 2023-01-30 MED ADMIN — AMIODARONE IN DEXTROSE,ISO-OSM 360 MG/200 ML (1.8 MG/ML) IV SOLN [307603]: 0.5 mg/min | INTRAVENOUS | @ 13:00:00 | NDC 43066036020

## 2023-01-30 MED ADMIN — METRONIDAZOLE IN NACL (ISO-OS) 500 MG/100 ML IV PGBK [5018]: 500 mg | INTRAVENOUS | @ 02:00:00 | NDC 00409015201

## 2023-01-30 MED ADMIN — MAGNESIUM SULFATE IN D5W 1 GRAM/100 ML IV PGBK [166578]: 1 g | INTRAVENOUS | @ 10:00:00 | Stop: 2023-01-30 | NDC 00264440054

## 2023-01-30 MED ADMIN — PANTOPRAZOLE 40 MG IV SOLR [78621]: 40 mg | INTRAVENOUS | @ 02:00:00 | NDC 55150020200

## 2023-01-30 MED ADMIN — QUETIAPINE 25 MG PO TAB [76945]: 12.5 mg | NASOGASTRIC | @ 02:00:00 | NDC 00904663861

## 2023-01-31 ENCOUNTER — Inpatient Hospital Stay: Admit: 2023-01-31 | Discharge: 2023-01-31 | Payer: MEDICARE

## 2023-01-31 MED ADMIN — METRONIDAZOLE IN NACL (ISO-OS) 500 MG/100 ML IV PGBK [5018]: 500 mg | INTRAVENOUS | @ 15:00:00 | NDC 00409015201

## 2023-01-31 MED ADMIN — LINEZOLID IN DEXTROSE 5% 600 MG/300 ML IV PGBK [86283]: 600 mg | INTRAVENOUS | @ 03:00:00 | NDC 55150024251

## 2023-01-31 MED ADMIN — NYSTATIN 100,000 UNIT/ML PO SUSP [5751]: 500000 [IU] | ORAL | @ 18:00:00 | NDC 00121086805

## 2023-01-31 MED ADMIN — VASOPRESSIN 0.2 UNIT/ML IV SOLN [458566]: 3 [IU]/h | INTRAVENOUS | @ 06:00:00 | NDC 42023023701

## 2023-01-31 MED ADMIN — HEPARIN (PORCINE) IN 5 % DEX 20,000 UNIT/500 ML (40 UNIT/ML) IV SOLP [3628]: 994 [IU]/h | INTRAVENOUS | @ 19:00:00 | NDC 00264956710

## 2023-01-31 MED ADMIN — SODIUM CHLORIDE 0.9 % IV PGBK (MB+) [95161]: 2 g | INTRAVENOUS | @ 14:00:00 | NDC 00338915930

## 2023-01-31 MED ADMIN — POTASSIUM CHLORIDE IN WATER 10 MEQ/50 ML IV PGBK [11075]: 10 meq | INTRAVENOUS | @ 05:00:00 | Stop: 2023-01-31 | NDC 00338070541

## 2023-01-31 MED ADMIN — HYDROCORTISONE SOD SUCC (PF) 100 MG/2 ML IJ SOLR [302326]: 50 mg | INTRAVENOUS | @ 14:00:00 | NDC 00009001103

## 2023-01-31 MED ADMIN — POTASSIUM CHLORIDE IN WATER 10 MEQ/50 ML IV PGBK [11075]: 10 meq | INTRAVENOUS | @ 17:00:00 | Stop: 2023-01-31 | NDC 00338070541

## 2023-01-31 MED ADMIN — SODIUM CHLORIDE 0.9 % IV PGBK (MB+) [95161]: 2 g | INTRAVENOUS | @ 01:00:00 | NDC 00338915930

## 2023-01-31 MED ADMIN — NYSTATIN 100,000 UNIT/ML PO SUSP [5751]: 500000 [IU] | ORAL | @ 22:00:00 | NDC 00121086805

## 2023-01-31 MED ADMIN — BUMETANIDE 0.25 MG/ML IJ SOLN [9308]: 2 mg | INTRAVENOUS | @ 14:00:00 | NDC 72205010101

## 2023-01-31 MED ADMIN — PANTOPRAZOLE 40 MG IV SOLR [78621]: 40 mg | INTRAVENOUS | @ 01:00:00 | NDC 55150020200

## 2023-01-31 MED ADMIN — AMIODARONE IN DEXTROSE,ISO-OSM 360 MG/200 ML (1.8 MG/ML) IV SOLN [307603]: 0.5 mg/min | INTRAVENOUS | @ 22:00:00 | NDC 43066036020

## 2023-01-31 MED ADMIN — POTASSIUM CHLORIDE IN WATER 10 MEQ/50 ML IV PGBK [11075]: 10 meq | INTRAVENOUS | @ 15:00:00 | Stop: 2023-01-31 | NDC 00338070541

## 2023-01-31 MED ADMIN — CEFEPIME 2 GRAM IJ SOLR [78195]: 2 g | INTRAVENOUS | @ 14:00:00 | NDC 60505614700

## 2023-01-31 MED ADMIN — ONDANSETRON HCL (PF) 4 MG/2 ML IJ SOLN [136012]: 4 mg | INTRAVENOUS | @ 04:00:00 | NDC 72266012301

## 2023-01-31 MED ADMIN — HYDROCORTISONE SOD SUCC (PF) 100 MG/2 ML IJ SOLR [302326]: 50 mg | INTRAVENOUS | @ 01:00:00 | NDC 00009001103

## 2023-01-31 MED ADMIN — METRONIDAZOLE IN NACL (ISO-OS) 500 MG/100 ML IV PGBK [5018]: 500 mg | INTRAVENOUS | @ 01:00:00 | NDC 00409015201

## 2023-01-31 MED ADMIN — VASOPRESSIN 0.2 UNIT/ML IV SOLN [458566]: 2.4 [IU]/h | INTRAVENOUS | @ 13:00:00 | NDC 42023023701

## 2023-01-31 MED ADMIN — PANTOPRAZOLE 40 MG IV SOLR [78621]: 40 mg | INTRAVENOUS | @ 19:00:00 | NDC 55150020200

## 2023-01-31 MED ADMIN — MAGNESIUM SULFATE IN D5W 1 GRAM/100 ML IV PGBK [166578]: 1 g | INTRAVENOUS | @ 05:00:00 | Stop: 2023-02-01 | NDC 00264440054

## 2023-01-31 MED ADMIN — POTASSIUM CHLORIDE IN WATER 10 MEQ/50 ML IV PGBK [11075]: 10 meq | INTRAVENOUS | @ 16:00:00 | Stop: 2023-01-31 | NDC 00338070541

## 2023-01-31 MED ADMIN — PANTOPRAZOLE 40 MG IV SOLR [78621]: 40 mg | INTRAVENOUS | @ 15:00:00 | Stop: 2023-01-31 | NDC 55150020200

## 2023-01-31 MED ADMIN — AMIODARONE IN DEXTROSE,ISO-OSM 360 MG/200 ML (1.8 MG/ML) IV SOLN [307603]: 0.5 mg/min | INTRAVENOUS | @ 10:00:00 | NDC 43066036020

## 2023-01-31 MED ADMIN — NYSTATIN 100,000 UNIT/ML PO SUSP [5751]: 500000 [IU] | ORAL | @ 15:00:00 | NDC 00121086805

## 2023-01-31 MED ADMIN — CEFEPIME 2 GRAM IJ SOLR [78195]: 2 g | INTRAVENOUS | @ 01:00:00 | NDC 60505614700

## 2023-01-31 MED ADMIN — POTASSIUM CHLORIDE IN WATER 10 MEQ/50 ML IV PGBK [11075]: 10 meq | INTRAVENOUS | @ 19:00:00 | Stop: 2023-01-31 | NDC 00338070541

## 2023-02-01 ENCOUNTER — Encounter: Admit: 2023-02-01 | Discharge: 2023-02-01 | Payer: MEDICARE

## 2023-02-01 ENCOUNTER — Inpatient Hospital Stay: Admit: 2023-02-01 | Discharge: 2023-02-01 | Payer: MEDICARE

## 2023-02-01 MED ADMIN — MULTIVITAMIN PO TAB [37167]: 1 | ORAL | @ 21:00:00 | NDC 00536354710

## 2023-02-01 MED ADMIN — POTASSIUM CHLORIDE IN WATER 10 MEQ/50 ML IV PGBK [11075]: 10 meq | INTRAVENOUS | @ 10:00:00 | Stop: 2023-02-01 | NDC 00338070541

## 2023-02-01 MED ADMIN — PANTOPRAZOLE 40 MG IV SOLR [78621]: 40 mg | INTRAVENOUS | @ 16:00:00 | NDC 55150020200

## 2023-02-01 MED ADMIN — POTASSIUM CHLORIDE IN WATER 10 MEQ/50 ML IV PGBK [11075]: 10 meq | INTRAVENOUS | @ 12:00:00 | Stop: 2023-02-01 | NDC 00338070541

## 2023-02-01 MED ADMIN — NYSTATIN 100,000 UNIT/ML PO SUSP [5751]: 500000 [IU] | ORAL | @ 02:00:00 | NDC 00121086805

## 2023-02-01 MED ADMIN — NYSTATIN 100,000 UNIT/ML PO SUSP [5751]: 500000 [IU] | ORAL | @ 13:00:00 | NDC 00121086805

## 2023-02-01 MED ADMIN — AMIODARONE IN DEXTROSE,ISO-OSM 360 MG/200 ML (1.8 MG/ML) IV SOLN [307603]: 0.5 mg/min | INTRAVENOUS | @ 09:00:00 | NDC 43066036020

## 2023-02-01 MED ADMIN — PANTOPRAZOLE 40 MG IV SOLR [78621]: 40 mg | INTRAVENOUS | @ 02:00:00 | NDC 71288060010

## 2023-02-01 MED ADMIN — METRONIDAZOLE IN NACL (ISO-OS) 500 MG/100 ML IV PGBK [5018]: 500 mg | INTRAVENOUS | @ 01:00:00 | NDC 00409015201

## 2023-02-01 MED ADMIN — HYDROCORTISONE SOD SUCC (PF) 100 MG/2 ML IJ SOLR [302326]: 50 mg | INTRAVENOUS | @ 02:00:00 | NDC 00009001103

## 2023-02-01 MED ADMIN — POTASSIUM CHLORIDE IN WATER 10 MEQ/50 ML IV PGBK [11075]: 10 meq | INTRAVENOUS | @ 14:00:00 | Stop: 2023-02-01 | NDC 00338070541

## 2023-02-01 MED ADMIN — SODIUM CHLORIDE 0.9 % IV PGBK (MB+) [95161]: 2 g | INTRAVENOUS | @ 13:00:00 | Stop: 2023-02-01 | NDC 00338915930

## 2023-02-01 MED ADMIN — CEFEPIME 2 GRAM IJ SOLR [78195]: 2 g | INTRAVENOUS | @ 01:00:00 | NDC 60505614700

## 2023-02-01 MED ADMIN — AMIODARONE IN DEXTROSE,ISO-OSM 360 MG/200 ML (1.8 MG/ML) IV SOLN [307603]: 0.5 mg/min | INTRAVENOUS | @ 20:00:00 | NDC 43066036020

## 2023-02-01 MED ADMIN — SODIUM CHLORIDE 0.9 % IV PGBK (MB+) [95161]: 2 g | INTRAVENOUS | @ 01:00:00 | NDC 00338915930

## 2023-02-01 MED ADMIN — BUMETANIDE 0.25 MG/ML IJ SOLN [9308]: 2 mg | INTRAVENOUS | @ 13:00:00 | Stop: 2023-02-02 | NDC 72205010101

## 2023-02-01 MED ADMIN — CEFEPIME 2 GRAM IJ SOLR [78195]: 2 g | INTRAVENOUS | @ 13:00:00 | Stop: 2023-02-01 | NDC 60505614700

## 2023-02-01 MED ADMIN — HEPARIN (PORCINE) IN 5 % DEX 20,000 UNIT/500 ML (40 UNIT/ML) IV SOLP [3628]: 794 [IU]/h | INTRAVENOUS | @ 18:00:00 | NDC 00264956710

## 2023-02-01 MED ADMIN — MAGNESIUM SULFATE IN D5W 1 GRAM/100 ML IV PGBK [166578]: 1 g | INTRAVENOUS | @ 10:00:00 | Stop: 2023-02-01 | NDC 44567041024

## 2023-02-01 MED ADMIN — BUMETANIDE 0.25 MG/ML IJ SOLN [9308]: 2 mg | INTRAVENOUS | @ 02:00:00 | Stop: 2023-02-01 | NDC 72205010101

## 2023-02-01 MED ADMIN — HYDROCORTISONE SOD SUCC (PF) 100 MG/2 ML IJ SOLR [302326]: 50 mg | INTRAVENOUS | @ 13:00:00 | Stop: 2023-02-01 | NDC 00009001103

## 2023-02-01 MED ADMIN — METRONIDAZOLE IN NACL (ISO-OS) 500 MG/100 ML IV PGBK [5018]: 500 mg | INTRAVENOUS | @ 13:00:00 | Stop: 2023-02-01 | NDC 00409015201

## 2023-02-01 MED ADMIN — NYSTATIN 100,000 UNIT/ML PO SUSP [5751]: 500000 [IU] | ORAL | @ 19:00:00 | NDC 00121086805

## 2023-02-02 ENCOUNTER — Encounter: Admit: 2023-02-02 | Discharge: 2023-02-02 | Payer: MEDICARE

## 2023-02-02 ENCOUNTER — Inpatient Hospital Stay: Admit: 2023-02-02 | Discharge: 2023-02-01 | Payer: MEDICARE

## 2023-02-02 ENCOUNTER — Inpatient Hospital Stay: Admit: 2023-02-02 | Discharge: 2023-02-02 | Payer: MEDICARE

## 2023-02-02 MED ADMIN — VASOPRESSIN 0.2 UNIT/ML IV SOLN [458566]: 1.8 [IU]/h | INTRAVENOUS | @ 18:00:00 | NDC 42023023701

## 2023-02-02 MED ADMIN — PANTOPRAZOLE 40 MG IV SOLR [78621]: 40 mg | INTRAVENOUS | @ 17:00:00 | NDC 55150020200

## 2023-02-02 MED ADMIN — CEFEPIME 2 GRAM IJ SOLR [78195]: 2 g | INTRAVENOUS | @ 14:00:00 | Stop: 2023-02-03 | NDC 60505614700

## 2023-02-02 MED ADMIN — DEXTROSE 50 % IN WATER (D50W) IV SYRG [2365]: 25 mL | INTRAVENOUS | @ 21:00:00 | NDC 70092147550

## 2023-02-02 MED ADMIN — POTASSIUM CHLORIDE IN WATER 10 MEQ/50 ML IV PGBK [11075]: 10 meq | INTRAVENOUS | @ 03:00:00 | Stop: 2023-02-02 | NDC 00338070541

## 2023-02-02 MED ADMIN — SODIUM BICARBONATE 1 MEQ/ML (8.4 %) IV SOLN [7309]: 100 meq | INTRAVENOUS | @ 22:00:00 | Stop: 2023-02-02 | NDC 81665020001

## 2023-02-02 MED ADMIN — EPINEPHRINE 1 MG/ML IJ SOLN [2850]: 0.05 ug/kg/min | INTRAVENOUS | @ 21:00:00 | NDC 76329906000

## 2023-02-02 MED ADMIN — POTASSIUM CHLORIDE IN WATER 10 MEQ/50 ML IV PGBK [11075]: 10 meq | INTRAVENOUS | @ 06:00:00 | Stop: 2023-02-02 | NDC 00338070541

## 2023-02-02 MED ADMIN — BUMETANIDE 0.25 MG/ML IJ SOLN [9308]: 2 mg | INTRAVENOUS | @ 03:00:00 | Stop: 2023-02-02 | NDC 65219057001

## 2023-02-02 MED ADMIN — POTASSIUM CHLORIDE IN WATER 10 MEQ/50 ML IV PGBK [11075]: 10 meq | INTRAVENOUS | @ 12:00:00 | Stop: 2023-02-02 | NDC 00338070541

## 2023-02-02 MED ADMIN — ROCURONIUM 10 MG/ML IV SOLN [78847]: 40 mg | INTRAVENOUS | @ 13:00:00 | Stop: 2023-02-02 | NDC 71288071810

## 2023-02-02 MED ADMIN — MULTIVITAMIN PO TAB [37167]: 1 | ORAL | @ 15:00:00 | NDC 00536354710

## 2023-02-02 MED ADMIN — FENTANYL DRIP IN NS 1000MCG/100ML [30807]: 20 ug/h | INTRAVENOUS | @ 20:00:00 | NDC 70004020232

## 2023-02-02 MED ADMIN — AMIODARONE IN DEXTROSE,ISO-OSM 360 MG/200 ML (1.8 MG/ML) IV SOLN [307603]: 0.5 mg/min | INTRAVENOUS | @ 06:00:00 | NDC 43066036020

## 2023-02-02 MED ADMIN — PROPOFOL 10 MG/ML IV EMUL [11150]: 20 ug/kg/min | INTRAVENOUS | @ 23:00:00 | NDC 63323026965

## 2023-02-02 MED ADMIN — ATORVASTATIN 40 MG PO TAB [77113]: 40 mg | OROGASTRIC | @ 15:00:00 | NDC 00904629261

## 2023-02-02 MED ADMIN — FENTANYL DRIP IN NS 1000MCG/100ML [30807]: 25 ug/h | INTRAVENOUS | @ 14:00:00 | Stop: 2023-02-02 | NDC 70004020232

## 2023-02-02 MED ADMIN — PANTOPRAZOLE 40 MG IV SOLR [78621]: 40 mg | INTRAVENOUS | @ 02:00:00 | NDC 55150020200

## 2023-02-02 MED ADMIN — SODIUM CHLORIDE 0.9 % IV PGBK (MB+) [95161]: 2 g | INTRAVENOUS | @ 14:00:00 | Stop: 2023-02-03 | NDC 00338915930

## 2023-02-02 MED ADMIN — PEG-ELECTROLYTE SOLN 420 GRAM PO SOLR [79142]: 100 mL/h | NASOGASTRIC | @ 18:00:00 | Stop: 2023-02-03 | NDC 64380076921

## 2023-02-02 MED ADMIN — ETOMIDATE 2 MG/ML IV SOLN [20472]: 20 mg | INTRAVENOUS | @ 13:00:00 | Stop: 2023-02-02 | NDC 55150022220

## 2023-02-02 MED ADMIN — SODIUM CHLORIDE 0.9 % IV SOLP [27838]: 0.05 ug/kg/min | INTRAVENOUS | @ 21:00:00 | NDC 00338004902

## 2023-02-02 MED ADMIN — SODIUM CHLORIDE 0.65 % NA SPRA [29676]: 2 | NASAL | @ 05:00:00 | NDC 00904386575

## 2023-02-02 MED ADMIN — WHITE PETROLATUM-MINERAL OIL 57.7-31.9 % OP OINT [326771]: 0.25 [in_us] | OPHTHALMIC | @ 23:00:00 | NDC 63736014308

## 2023-02-02 MED ADMIN — NYSTATIN 100,000 UNIT/ML PO SUSP [5751]: 500000 [IU] | ORAL | @ 18:00:00 | NDC 00121086805

## 2023-02-02 MED ADMIN — POTASSIUM CHLORIDE IN WATER 10 MEQ/50 ML IV PGBK [11075]: 10 meq | INTRAVENOUS | @ 04:00:00 | Stop: 2023-02-02 | NDC 00338070541

## 2023-02-02 MED ADMIN — NYSTATIN 100,000 UNIT/ML PO SUSP [5751]: 500000 [IU] | ORAL | @ 14:00:00 | NDC 00121086805

## 2023-02-02 MED ADMIN — POTASSIUM CHLORIDE IN WATER 10 MEQ/50 ML IV PGBK [11075]: 10 meq | INTRAVENOUS | @ 14:00:00 | Stop: 2023-02-02 | NDC 00338070541

## 2023-02-02 MED ADMIN — POTASSIUM CHLORIDE IN WATER 10 MEQ/50 ML IV PGBK [11075]: 10 meq | INTRAVENOUS | @ 05:00:00 | Stop: 2023-02-02 | NDC 00338070541

## 2023-02-02 MED ADMIN — DEXTROSE 5% IN WATER IV SOLP [2364]: 0.05 ug/kg/min | INTRAVENOUS | @ 16:00:00 | NDC 00338001702

## 2023-02-02 MED ADMIN — NYSTATIN 100,000 UNIT/ML PO SUSP [5751]: 500000 [IU] | ORAL | @ 22:00:00 | NDC 00121086805

## 2023-02-02 MED ADMIN — NOREPINEPHRINE BITARTRATE 1 MG/ML IV SOLN [10734]: 0.05 ug/kg/min | INTRAVENOUS | @ 16:00:00 | NDC 51991098399

## 2023-02-02 MED ADMIN — PROPOFOL 10 MG/ML IV EMUL [11150]: 30 ug/kg/min | INTRAVENOUS | @ 14:00:00 | Stop: 2023-02-02 | NDC 63323026965

## 2023-02-02 MED ADMIN — HEPARIN (PORCINE) IN 5 % DEX 20,000 UNIT/500 ML (40 UNIT/ML) IV SOLP [3628]: 794 [IU]/h | INTRAVENOUS | @ 18:00:00 | NDC 00264956710

## 2023-02-02 MED ADMIN — MAGNESIUM SULFATE IN D5W 1 GRAM/100 ML IV PGBK [166578]: 1 g | INTRAVENOUS | @ 14:00:00 | Stop: 2023-02-02 | NDC 00264440054

## 2023-02-02 MED ADMIN — FOLIC ACID 1 MG PO TAB [3233]: 1 mg | OROGASTRIC | @ 15:00:00 | NDC 00904722461

## 2023-02-02 MED ADMIN — AMIODARONE IN DEXTROSE,ISO-OSM 360 MG/200 ML (1.8 MG/ML) IV SOLN [307603]: 0.5 mg/min | INTRAVENOUS | @ 17:00:00 | NDC 43066036020

## 2023-02-03 MED ADMIN — EPINEPHRINE 1 MG/ML IJ SOLN [2850]: 1.3 ug/kg/min | INTRAVENOUS | @ 04:00:00 | NDC 76329906000

## 2023-02-03 MED ADMIN — SODIUM CHLORIDE 0.9 % IV SOLP [27838]: 1.3 ug/kg/min | INTRAVENOUS | @ 04:00:00 | NDC 00338004902

## 2023-02-03 MED ADMIN — AMIODARONE IN DEXTROSE,ISO-OSM 360 MG/200 ML (1.8 MG/ML) IV SOLN [307603]: 0.5 mg/min | INTRAVENOUS | @ 04:00:00 | NDC 43066036020

## 2023-02-03 MED ADMIN — METHYLPREDNISOLONE SODIUM SUCC 1,000 MG IV SOLR [10577]: 1000 mg | INTRAVENOUS | @ 15:00:00 | Stop: 2023-02-03 | NDC 43598013074

## 2023-02-03 MED ADMIN — SODIUM BICARBONATE 1 MEQ/ML (8.4 %) IV SOLN [7309]: 250 meq | INTRAVENOUS | @ 10:00:00 | Stop: 2023-02-03 | NDC 81665020001

## 2023-02-03 MED ADMIN — VASOPRESSIN 0.2 UNIT/ML IV SOLN [458566]: 2.4 [IU]/h | INTRAVENOUS | @ 11:00:00 | Stop: 2023-02-03 | NDC 42023023701

## 2023-02-03 MED ADMIN — VANCOMYCIN 1.25 GRAM IV SOLR [338455]: 1250 mg | INTRAVENOUS | @ 03:00:00 | Stop: 2023-02-03 | NDC 55150047101

## 2023-02-03 MED ADMIN — DEXTROSE 5% IN WATER IV SOLP [2364]: 0.5 ug/kg/min | INTRAVENOUS | @ 01:00:00 | NDC 00338001702

## 2023-02-03 MED ADMIN — PHOSPHAT DIALY W-OUT DEXT NO.2 K (4)-CA (2.5 MEQ/L)-PO4 (1) HEMO SOLN [328148]: 5000.000 mL | INTRAVENOUS_CENTRAL | @ 12:00:00 | Stop: 2023-02-03 | NDC 24571011606

## 2023-02-03 MED ADMIN — BICARBONATE DIALYSIS SOLN NO.2 K (2 MEQ/L) -CA (3.5)-MG(1) HEMO SOLN [170611]: 5000.000 mL | INTRAVENOUS_CENTRAL | @ 17:00:00 | Stop: 2023-02-03 | NDC 24571010306

## 2023-02-03 MED ADMIN — SODIUM CHLORIDE 0.9 % IV SOLP [27838]: 2 g | INTRAVENOUS | @ 09:00:00 | Stop: 2023-02-03 | NDC 00338004938

## 2023-02-03 MED ADMIN — SODIUM CHLORIDE 0.65 % NA SPRA [29676]: 2 | NASAL | @ 05:00:00 | NDC 00904386575

## 2023-02-03 MED ADMIN — SODIUM CHLORIDE 0.9 % IV SOLP [27838]: 1 ug/kg/min | INTRAVENOUS | @ 12:00:00 | Stop: 2023-02-03 | NDC 00338004902

## 2023-02-03 MED ADMIN — PHOSPHAT DIALY W-OUT DEXT NO.2 K (4)-CA (2.5 MEQ/L)-PO4 (1) HEMO SOLN [328148]: 5000.000 mL | INTRAVENOUS_CENTRAL | @ 15:00:00 | Stop: 2023-02-03 | NDC 24571011606

## 2023-02-03 MED ADMIN — WHITE PETROLATUM-MINERAL OIL 57.7-31.9 % OP OINT [326771]: 0.25 [in_us] | OPHTHALMIC | @ 05:00:00 | NDC 63736014308

## 2023-02-03 MED ADMIN — DOPAMINE IN 5 % DEXTROSE 400 MG/250 ML (1,600 MCG/ML) IV SOLN [171293]: 20 ug/kg/min | INTRAVENOUS | @ 09:00:00 | Stop: 2023-02-03 | NDC 00409780911

## 2023-02-03 MED ADMIN — PHENYLEPHRINE HCL 10 MG/ML IJ SOLN [6242]: 0.5 ug/kg/min | INTRAVENOUS | @ 03:00:00 | NDC 70121157801

## 2023-02-03 MED ADMIN — LORAZEPAM 2 MG/ML IJ SOLN GROUP [280020]: 1 mg | INTRAVENOUS | @ 17:00:00 | Stop: 2023-02-03 | NDC 00409198503

## 2023-02-03 MED ADMIN — PANTOPRAZOLE 40 MG IV SOLR [78621]: 40 mg | INTRAVENOUS | @ 02:00:00 | NDC 55150020200

## 2023-02-03 MED ADMIN — ANGIOTENSIN II 2.5 MG/ML IV SOLN [335628]: 20 ng/kg/min | INTRAVENOUS | @ 02:00:00 | Stop: 2023-02-03 | NDC 68547050102

## 2023-02-03 MED ADMIN — DEXTROSE 50 % IN WATER (D50W) IV SYRG [2365]: 50 mL | INTRAVENOUS | @ 15:00:00 | Stop: 2023-02-03 | NDC 70092147550

## 2023-02-03 MED ADMIN — NOREPINEPHRINE BITARTRATE 1 MG/ML IV SOLN [10734]: 0.5 ug/kg/min | INTRAVENOUS | @ 01:00:00 | NDC 51991098399

## 2023-02-03 MED ADMIN — SODIUM ZIRCONIUM CYCLOSILICATE 10 GRAM PO PWPK [337243]: 10 g | NASOGASTRIC | @ 03:00:00 | Stop: 2023-02-04 | NDC 00310111001

## 2023-02-03 MED ADMIN — DEXTROSE 50 % IN WATER (D50W) IV SYRG [2365]: 50 mL | INTRAVENOUS | @ 07:00:00 | Stop: 2023-02-03 | NDC 70092147550

## 2023-02-03 MED ADMIN — DEXTROSE 50 % IN WATER (D50W) IV SYRG [2365]: 50 mL | INTRAVENOUS | @ 16:00:00 | Stop: 2023-02-03 | NDC 70092147550

## 2023-02-03 MED ADMIN — DOPAMINE IN 5 % DEXTROSE 400 MG/250 ML (1,600 MCG/ML) IV SOLN [171293]: 20 ug/kg/min | INTRAVENOUS | @ 15:00:00 | Stop: 2023-02-03 | NDC 00409780911

## 2023-02-03 MED ADMIN — DOPAMINE IN 5 % DEXTROSE 400 MG/250 ML (1,600 MCG/ML) IV SOLN [171293]: 5 ug/kg/min | INTRAVENOUS | @ 03:00:00 | NDC 00409780911

## 2023-02-03 MED ADMIN — DEXTROSE 50 % IN WATER (D50W) IV SYRG [2365]: 50 mL | INTRAVENOUS | @ 03:00:00 | NDC 70092147550

## 2023-02-03 MED ADMIN — MEROPENEM 1 GRAM IV SOLR [80713]: 2 g | INTRAVENOUS | @ 14:00:00 | Stop: 2023-02-03 | NDC 55150020830

## 2023-02-03 MED ADMIN — DEXTROSE 5% IN WATER IV SOLP [2364]: 0.6 ug/kg/min | INTRAVENOUS | @ 09:00:00 | Stop: 2023-02-03 | NDC 00338001702

## 2023-02-03 MED ADMIN — FLUDROCORTISONE 0.1 MG PO TAB [10054]: 0.05 mg | NASOGASTRIC | @ 03:00:00 | NDC 68084028811

## 2023-02-03 MED ADMIN — HYDROCORTISONE SOD SUCC (PF) 100 MG/2 ML IJ SOLR [302326]: 50 mg | INTRAVENOUS | @ 05:00:00 | NDC 00009001103

## 2023-02-03 MED ADMIN — ANGIOTENSIN II 2.5 MG/ML IV SOLN [335628]: 40 ng/kg/min | INTRAVENOUS | @ 16:00:00 | Stop: 2023-02-03 | NDC 68547050102

## 2023-02-03 MED ADMIN — PHOSPHAT DIALY W-OUT DEXT NO.2 K (4)-CA (2.5 MEQ/L)-PO4 (1) HEMO SOLN [328148]: 5000.000 mL | INTRAVENOUS_CENTRAL | @ 02:00:00 | NDC 24571011606

## 2023-02-03 MED ADMIN — MEROPENEM 1 GRAM IV SOLR [80713]: 2 g | INTRAVENOUS | @ 02:00:00 | Stop: 2023-02-03 | NDC 55150020830

## 2023-02-03 MED ADMIN — PHENYLEPHRINE HCL 10 MG/ML IJ SOLN [6242]: 3 ug/kg/min | INTRAVENOUS | @ 09:00:00 | Stop: 2023-02-03 | NDC 70756062285

## 2023-02-03 MED ADMIN — EPINEPHRINE 1 MG/ML IJ SOLN [2850]: 1.3 ug/kg/min | INTRAVENOUS | @ 07:00:00 | Stop: 2023-02-03 | NDC 76329906000

## 2023-02-03 MED ADMIN — SODIUM CHLORIDE 0.9 % IV SOLP [27838]: 500 mg | INTRAVENOUS | @ 16:00:00 | Stop: 2023-02-03 | NDC 00338004938

## 2023-02-03 MED ADMIN — DEXTROSE 50 % IN WATER (D50W) IV SYRG [2365]: 50 mL | INTRAVENOUS | @ 14:00:00 | Stop: 2023-02-03 | NDC 70092147550

## 2023-02-03 MED ADMIN — SODIUM CHLORIDE 0.9 % IV SOLP [27838]: 3 ug/kg/min | INTRAVENOUS | @ 09:00:00 | Stop: 2023-02-03 | NDC 00338004903

## 2023-02-03 MED ADMIN — NOREPINEPHRINE BITARTRATE 1 MG/ML IV SOLN [10734]: 0.6 ug/kg/min | INTRAVENOUS | @ 09:00:00 | Stop: 2023-02-03 | NDC 51991098399

## 2023-02-03 MED ADMIN — GLYCOPYRROLATE 0.2 MG/ML IJ SOLN [3497]: 0.4 mg | INTRAVENOUS | @ 17:00:00 | Stop: 2023-02-03 | NDC 71288041402

## 2023-02-03 MED ADMIN — ATORVASTATIN 40 MG PO TAB [77113]: 40 mg | OROGASTRIC | @ 14:00:00 | Stop: 2023-02-03 | NDC 00904629261

## 2023-02-03 MED ADMIN — SODIUM CHLORIDE 0.9 % IV SOLP [27838]: 2 g | INTRAVENOUS | @ 02:00:00 | Stop: 2023-02-03 | NDC 00338004938

## 2023-02-03 MED ADMIN — SODIUM CHLORIDE 0.9 % IV SOLP [27838]: 1.3 ug/kg/min | INTRAVENOUS | @ 07:00:00 | Stop: 2023-02-03 | NDC 00338004902

## 2023-02-03 MED ADMIN — SODIUM CHLORIDE 0.9 % IV SOLP [27838]: 1000 mg | INTRAVENOUS | @ 15:00:00 | Stop: 2023-02-03 | NDC 00338004938

## 2023-02-03 MED ADMIN — MULTIVITAMIN PO TAB [37167]: 1 | ORAL | @ 14:00:00 | Stop: 2023-02-03 | NDC 00536354710

## 2023-02-03 MED ADMIN — VASOPRESSIN 0.2 UNIT/ML IV SOLN [458566]: 2.4 [IU]/h | INTRAVENOUS | @ 03:00:00 | NDC 42023023701

## 2023-02-03 MED ADMIN — SODIUM CHLORIDE 0.9 % IV SOLP [27838]: 40 ng/kg/min | INTRAVENOUS | @ 16:00:00 | Stop: 2023-02-03 | NDC 00338004902

## 2023-02-03 MED ADMIN — SODIUM CHLORIDE 0.9 % IV SOLP [27838]: 1250 mg | INTRAVENOUS | @ 03:00:00 | Stop: 2023-02-03 | NDC 00338004902

## 2023-02-03 MED ADMIN — SODIUM ZIRCONIUM CYCLOSILICATE 10 GRAM PO PWPK [337243]: 10 g | NASOGASTRIC | @ 10:00:00 | Stop: 2023-02-03 | NDC 00310111001

## 2023-02-03 MED ADMIN — SODIUM CHLORIDE 0.9 % IV SOLP [27838]: 2 g | INTRAVENOUS | @ 14:00:00 | Stop: 2023-02-03 | NDC 00338004938

## 2023-02-03 MED ADMIN — PANTOPRAZOLE 40 MG IV SOLR [78621]: 40 mg | INTRAVENOUS | @ 15:00:00 | Stop: 2023-02-03 | NDC 55150020200

## 2023-02-03 MED ADMIN — NYSTATIN 100,000 UNIT/ML PO SUSP [5751]: 500000 [IU] | ORAL | @ 14:00:00 | Stop: 2023-02-03 | NDC 00121086805

## 2023-02-03 MED ADMIN — FENTANYL DRIP IN NS 1000MCG/100ML [30807]: 20 ug/h | INTRAVENOUS | @ 17:00:00 | Stop: 2023-02-03 | NDC 70004020232

## 2023-02-03 MED ADMIN — SODIUM BICARBONATE 1 MEQ/ML (8.4 %) IV SOLN [7309]: 250 meq | INTRAVENOUS | @ 02:00:00 | Stop: 2023-02-05 | NDC 81665020001

## 2023-02-03 MED ADMIN — HYDROCORTISONE SOD SUCC (PF) 100 MG/2 ML IJ SOLR [302326]: 50 mg | INTRAVENOUS | @ 10:00:00 | Stop: 2023-02-03 | NDC 00009001103

## 2023-02-03 MED ADMIN — FLUDROCORTISONE 0.1 MG PO TAB [10054]: 0.05 mg | NASOGASTRIC | @ 14:00:00 | Stop: 2023-02-03 | NDC 68084028811

## 2023-02-03 MED ADMIN — AMIODARONE IN DEXTROSE,ISO-OSM 360 MG/200 ML (1.8 MG/ML) IV SOLN [307603]: 0.5 mg/min | INTRAVENOUS | @ 15:00:00 | Stop: 2023-02-03 | NDC 43066036020

## 2023-02-03 MED ADMIN — VANCOMYCIN 5 GRAM IV SOLR [8444]: 500 mg | INTRAVENOUS | @ 16:00:00 | Stop: 2023-02-03 | NDC 71288002575

## 2023-02-03 MED ADMIN — DEXTROSE 20 % IN WATER (D20W) 20 % IV SOLP [2359]: 500.000 mL | INTRAVENOUS | @ 08:00:00 | Stop: 2023-02-03 | NDC 00990793519

## 2023-02-03 MED ADMIN — PHOSPHAT DIALY W-OUT DEXT NO.2 K (4)-CA (2.5 MEQ/L)-PO4 (1) HEMO SOLN [328148]: 5000.000 mL | INTRAVENOUS_CENTRAL | @ 08:00:00 | Stop: 2023-02-03 | NDC 24571011606

## 2023-02-03 MED ADMIN — HYDROCORTISONE SOD SUCC (PF) 100 MG/2 ML IJ SOLR [302326]: 100 mg | INTRAVENOUS | @ 02:00:00 | Stop: 2023-02-03 | NDC 00009001103

## 2023-02-03 MED ADMIN — SODIUM CHLORIDE 0.9 % IV SOLP [27838]: 20 ng/kg/min | INTRAVENOUS | @ 02:00:00 | Stop: 2023-02-03 | NDC 00338004902

## 2023-02-03 MED ADMIN — SODIUM BICARBONATE 1 MEQ/ML (8.4 %) IV SOLN [7309]: 50 meq | INTRAVENOUS | @ 02:00:00 | Stop: 2023-02-03 | NDC 81665020001

## 2023-02-03 MED ADMIN — FOLIC ACID 1 MG PO TAB [3233]: 1 mg | OROGASTRIC | @ 14:00:00 | Stop: 2023-02-03 | NDC 00904722461

## 2023-02-03 MED ADMIN — CALCIUM GLUCONATE 100 MG/ML (10%) IV SOLN [1312]: 2 g | INTRAVENOUS | @ 09:00:00 | Stop: 2023-02-03 | NDC 63323036001

## 2023-02-03 MED ADMIN — SODIUM CHLORIDE 0.9 % IV SOLP [27838]: 0.5 ug/kg/min | INTRAVENOUS | @ 03:00:00 | NDC 00338004903

## 2023-02-03 MED ADMIN — EPINEPHRINE 1 MG/ML IJ SOLN [2850]: 1 ug/kg/min | INTRAVENOUS | @ 12:00:00 | Stop: 2023-02-03 | NDC 76329906000

## 2023-02-20 DEATH — deceased

## 2023-03-01 ENCOUNTER — Encounter: Admit: 2023-03-01 | Discharge: 2023-03-01 | Payer: MEDICARE

## 2023-03-11 ENCOUNTER — Encounter: Admit: 2023-03-11 | Discharge: 2023-03-11 | Payer: MEDICARE

## 2023-04-26 ENCOUNTER — Encounter: Admit: 2023-04-26 | Discharge: 2023-04-26 | Payer: MEDICARE

## 2023-10-28 ENCOUNTER — Encounter: Admit: 2023-10-28 | Discharge: 2023-10-28 | Payer: MEDICARE
